# Patient Record
Sex: Female | Born: 1960 | Race: White | Hispanic: No | Marital: Married | Smoking: Never smoker
Health system: Southern US, Community
[De-identification: ages and names within clinical notes are randomized; demographics above are authoritative.]

## PROBLEM LIST (undated history)

## (undated) DIAGNOSIS — E559 Vitamin D deficiency, unspecified: Secondary | ICD-10-CM

## (undated) DIAGNOSIS — G4730 Sleep apnea, unspecified: Secondary | ICD-10-CM

## (undated) DIAGNOSIS — R0789 Other chest pain: Secondary | ICD-10-CM

## (undated) DIAGNOSIS — E785 Hyperlipidemia, unspecified: Secondary | ICD-10-CM

## (undated) DIAGNOSIS — M1990 Unspecified osteoarthritis, unspecified site: Secondary | ICD-10-CM

## (undated) DIAGNOSIS — N200 Calculus of kidney: Secondary | ICD-10-CM

## (undated) DIAGNOSIS — F988 Other specified behavioral and emotional disorders with onset usually occurring in childhood and adolescence: Secondary | ICD-10-CM

## (undated) DIAGNOSIS — R112 Nausea with vomiting, unspecified: Secondary | ICD-10-CM

## (undated) DIAGNOSIS — E039 Hypothyroidism, unspecified: Secondary | ICD-10-CM

## (undated) DIAGNOSIS — K219 Gastro-esophageal reflux disease without esophagitis: Secondary | ICD-10-CM

## (undated) DIAGNOSIS — E063 Autoimmune thyroiditis: Secondary | ICD-10-CM

## (undated) DIAGNOSIS — D6851 Activated protein C resistance: Secondary | ICD-10-CM

## (undated) DIAGNOSIS — S0300XA Dislocation of jaw, unspecified side, initial encounter: Secondary | ICD-10-CM

## (undated) DIAGNOSIS — I10 Essential (primary) hypertension: Secondary | ICD-10-CM

## (undated) DIAGNOSIS — Z87442 Personal history of urinary calculi: Secondary | ICD-10-CM

## (undated) DIAGNOSIS — F419 Anxiety disorder, unspecified: Secondary | ICD-10-CM

## (undated) DIAGNOSIS — Z9889 Other specified postprocedural states: Secondary | ICD-10-CM

## (undated) DIAGNOSIS — R0609 Other forms of dyspnea: Secondary | ICD-10-CM

## (undated) DIAGNOSIS — G43909 Migraine, unspecified, not intractable, without status migrainosus: Secondary | ICD-10-CM

## (undated) DIAGNOSIS — R0600 Dyspnea, unspecified: Secondary | ICD-10-CM

## (undated) HISTORY — DX: Other forms of dyspnea: R06.09

## (undated) HISTORY — DX: Autoimmune thyroiditis: E06.3

## (undated) HISTORY — PX: KNEE ARTHROSCOPY: SUR90

## (undated) HISTORY — PX: COLONOSCOPY: SHX174

## (undated) HISTORY — PX: WISDOM TOOTH EXTRACTION: SHX21

## (undated) HISTORY — PX: TUBAL LIGATION: SHX77

## (undated) HISTORY — DX: Other specified behavioral and emotional disorders with onset usually occurring in childhood and adolescence: F98.8

## (undated) HISTORY — DX: Vitamin D deficiency, unspecified: E55.9

## (undated) HISTORY — PX: FINGER SURGERY: SHX640

## (undated) HISTORY — PX: CYSTOSCOPY W/ URETEROSCOPY: SUR379

## (undated) HISTORY — DX: Hypothyroidism, unspecified: E03.9

## (undated) HISTORY — DX: Migraine, unspecified, not intractable, without status migrainosus: G43.909

## (undated) HISTORY — DX: Other chest pain: R07.89

## (undated) HISTORY — DX: Dyspnea, unspecified: R06.00

## (undated) HISTORY — DX: Dislocation of jaw, unspecified side, initial encounter: S03.00XA

## (undated) HISTORY — DX: Calculus of kidney: N20.0

## (undated) HISTORY — DX: Hyperlipidemia, unspecified: E78.5

## (undated) HISTORY — DX: Anxiety disorder, unspecified: F41.9

## (undated) HISTORY — DX: Gastro-esophageal reflux disease without esophagitis: K21.9

---

## 1998-03-19 ENCOUNTER — Other Ambulatory Visit: Admission: RE | Admit: 1998-03-19 | Discharge: 1998-03-19 | Payer: Self-pay | Admitting: Obstetrics and Gynecology

## 2000-08-17 ENCOUNTER — Encounter: Admission: RE | Admit: 2000-08-17 | Discharge: 2000-08-17 | Payer: Self-pay | Admitting: Obstetrics and Gynecology

## 2000-08-17 ENCOUNTER — Encounter: Payer: Self-pay | Admitting: Obstetrics and Gynecology

## 2010-06-08 ENCOUNTER — Emergency Department (HOSPITAL_COMMUNITY): Admission: EM | Admit: 2010-06-08 | Discharge: 2010-06-08 | Payer: Self-pay | Admitting: Emergency Medicine

## 2010-11-13 LAB — GLUCOSE, CAPILLARY: Glucose-Capillary: 242 mg/dL — ABNORMAL HIGH (ref 70–99)

## 2011-04-24 ENCOUNTER — Other Ambulatory Visit: Payer: Self-pay | Admitting: Obstetrics and Gynecology

## 2011-04-24 DIAGNOSIS — R928 Other abnormal and inconclusive findings on diagnostic imaging of breast: Secondary | ICD-10-CM

## 2011-05-05 ENCOUNTER — Ambulatory Visit
Admission: RE | Admit: 2011-05-05 | Discharge: 2011-05-05 | Disposition: A | Discharge status: Home / Self Care | Payer: BC Managed Care – PPO | Source: Ambulatory Visit | Attending: Obstetrics and Gynecology | Admitting: Obstetrics and Gynecology

## 2011-05-05 DIAGNOSIS — R928 Other abnormal and inconclusive findings on diagnostic imaging of breast: Secondary | ICD-10-CM

## 2011-11-09 IMAGING — CT CT HEAD W/O CM
1 series · 16 of 30 positions shown, 20 images · non-contrast
Comparison: None.

CLINICAL DATA: Headache and dizziness.

CT HEAD WITHOUT CONTRAST
TECHNIQUE: Contiguous axial images were obtained from the base of
the skull through the vertex without contrast.

[Series 2: headseq 4.8 h45s · axial · 0.43mm/px · z∈[-198,-46]mm · 16 of 36 slices shown, 20 images]
[im 2/36  brain]
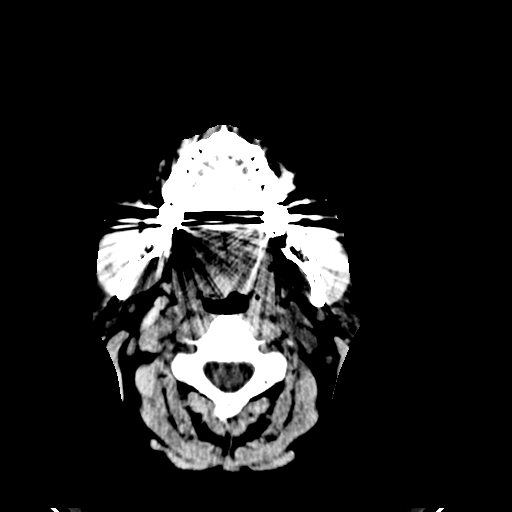
[im 2/36  bone]
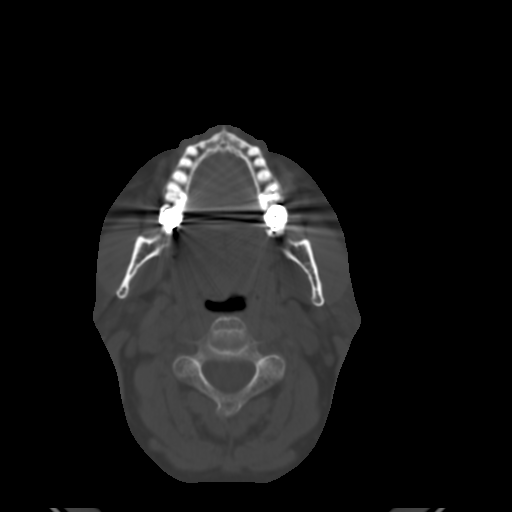
[im 4/36  brain]
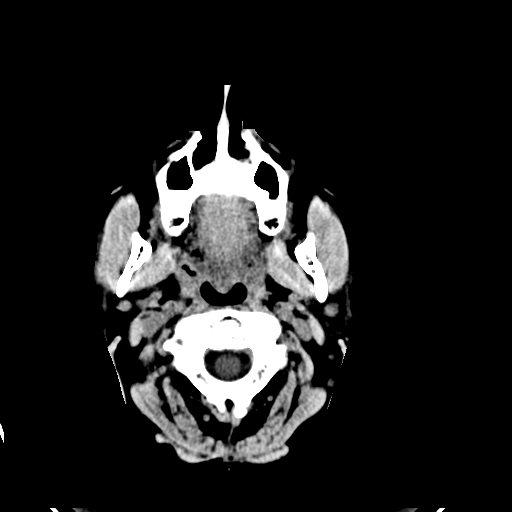
[im 7/36  brain]
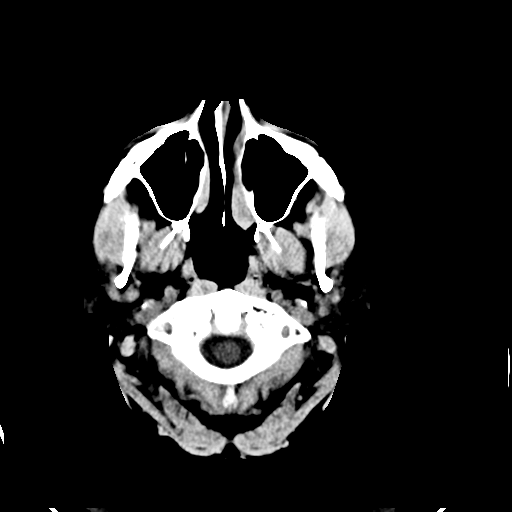
[im 9/36  brain]
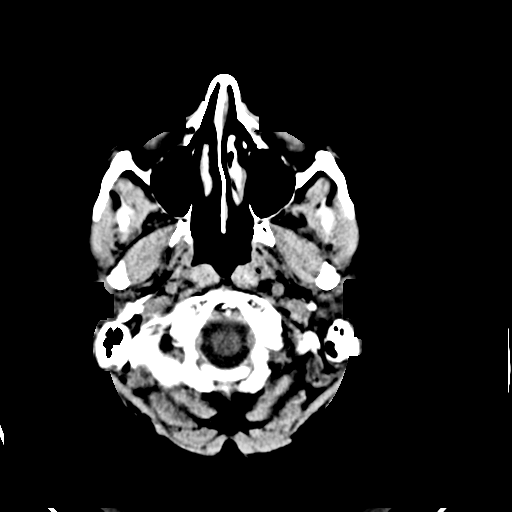
[im 10/36  brain]
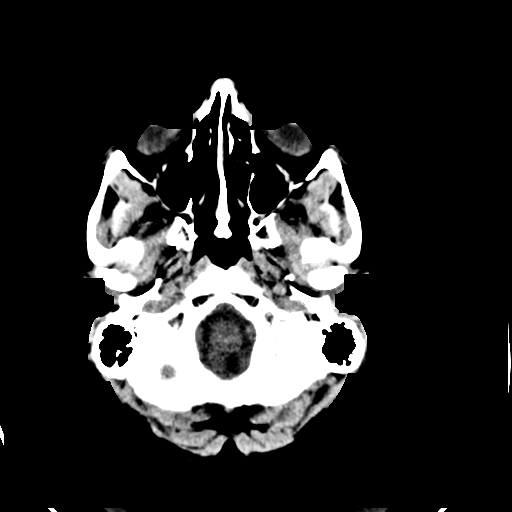
[im 10/36  bone]
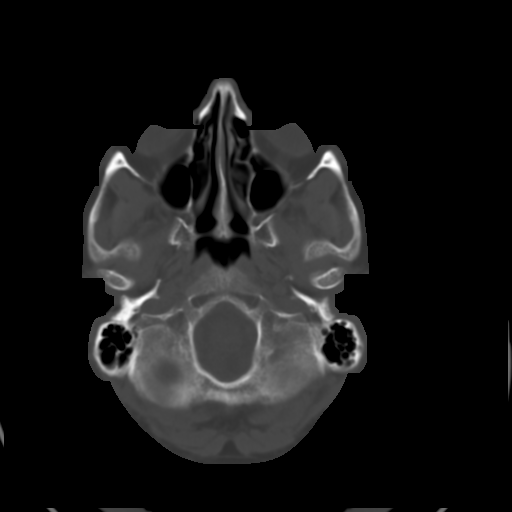
[im 13/36  brain]
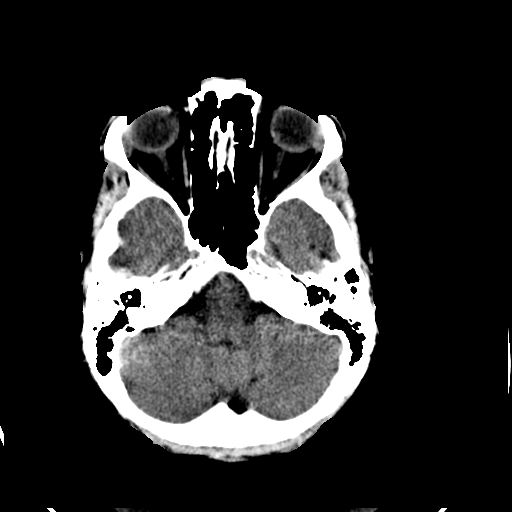
[im 15/36  brain]
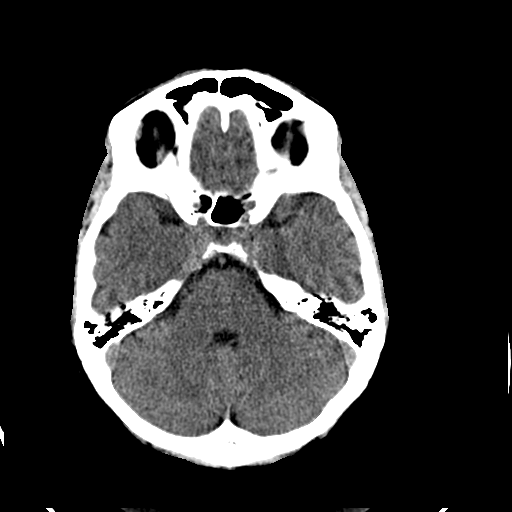
[im 17/36  brain]
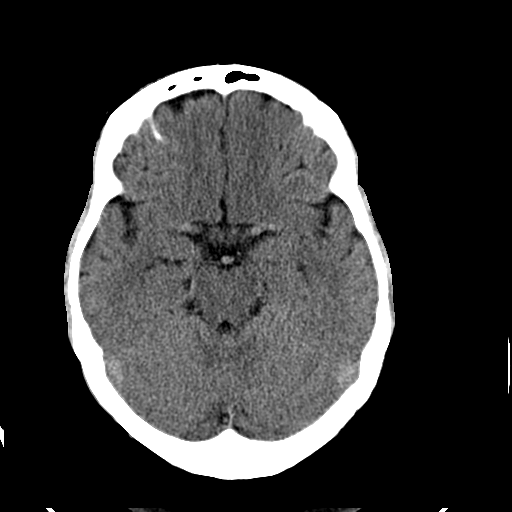
[im 19/36  brain]
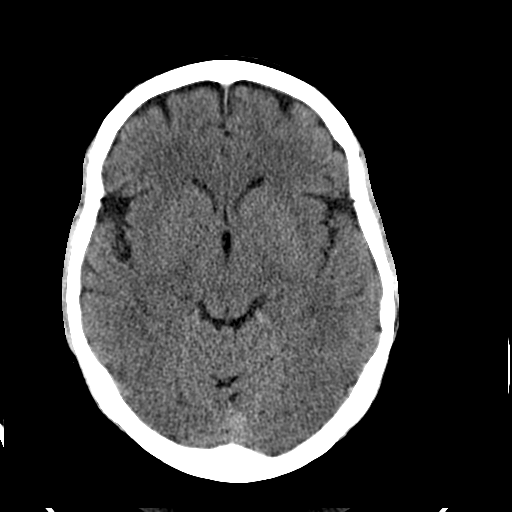
[im 19/36  bone]
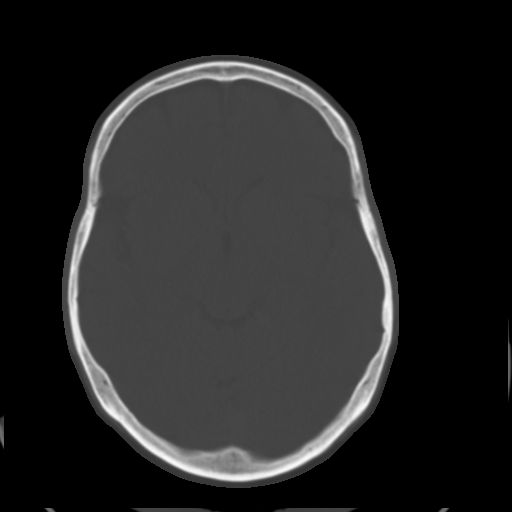
[im 21/36  brain]
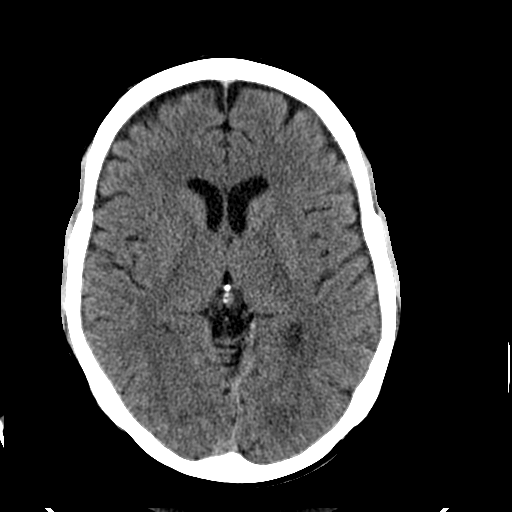
[im 23/36  brain]
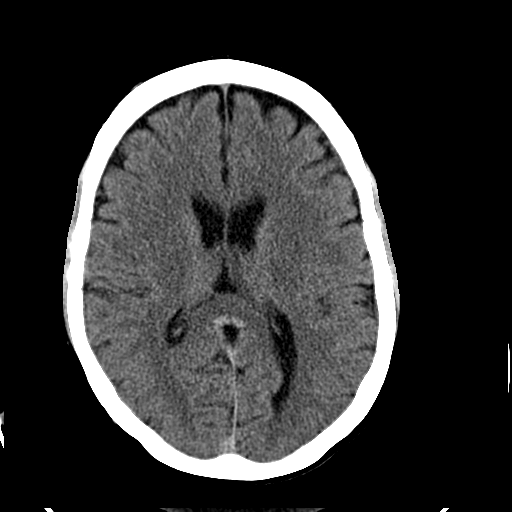
[im 26/36  brain]
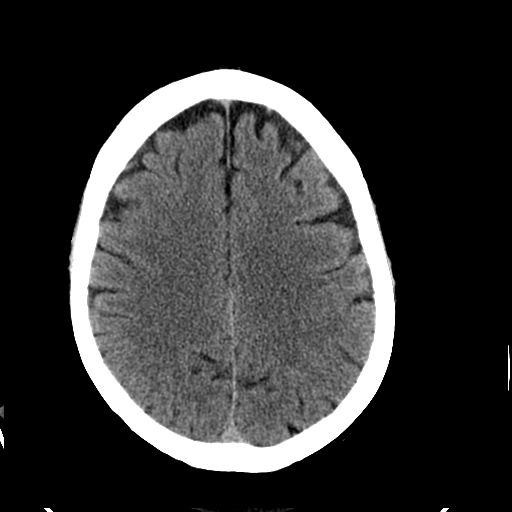
[im 27/36  brain]
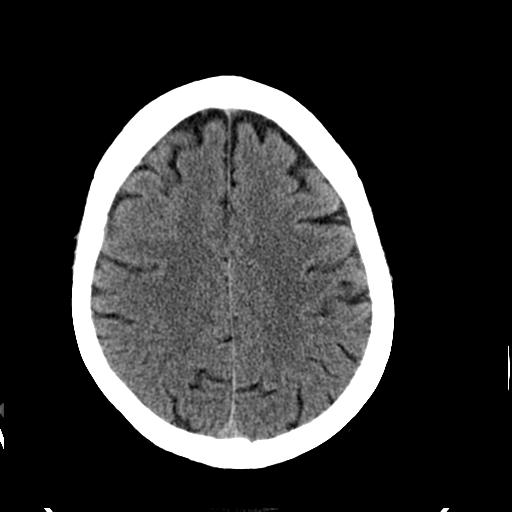
[im 27/36  bone]
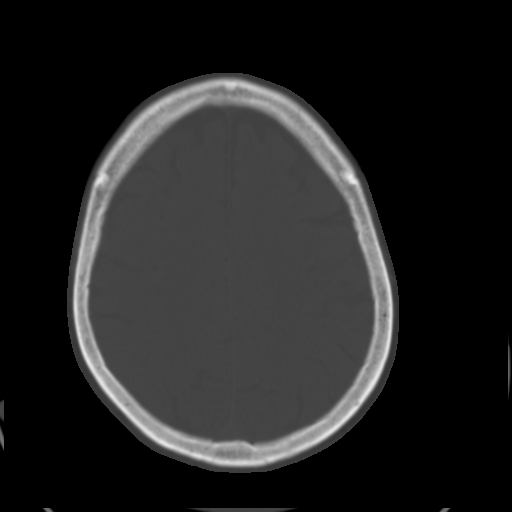
[im 29/36  brain]
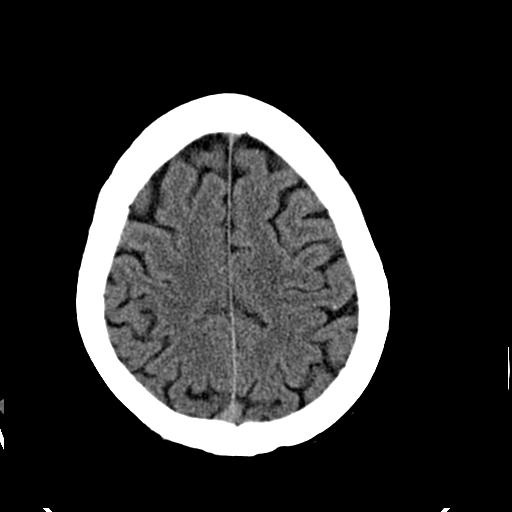
[im 32/36  brain]
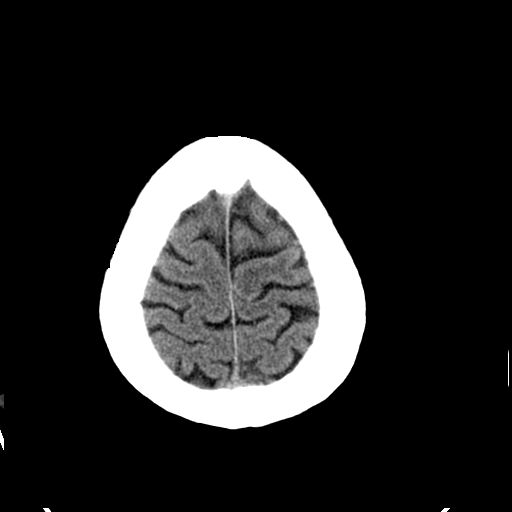
[im 34/36  brain]
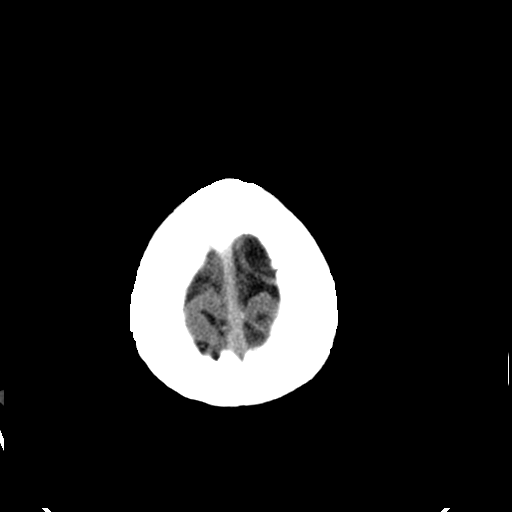

[16 of 30 positions shown; findings below may reference images not displayed]

FINDINGS: The brain appears normal without evidence of acute
infarction, hemorrhage, mass lesion, mass effect, midline shift or
abnormal extra-axial fluid collection.  No hydrocephalus.
Calvarium intact.
IMPRESSION: Negative exam.

## 2012-04-12 ENCOUNTER — Other Ambulatory Visit: Payer: Self-pay | Admitting: Obstetrics and Gynecology

## 2012-04-12 DIAGNOSIS — Z1231 Encounter for screening mammogram for malignant neoplasm of breast: Secondary | ICD-10-CM

## 2012-04-18 ENCOUNTER — Ambulatory Visit: Payer: BC Managed Care – PPO

## 2012-04-18 ENCOUNTER — Ambulatory Visit
Admission: RE | Admit: 2012-04-18 | Discharge: 2012-04-18 | Disposition: A | Discharge status: Home / Self Care | Payer: BC Managed Care – PPO | Source: Ambulatory Visit | Attending: Obstetrics and Gynecology | Admitting: Obstetrics and Gynecology

## 2012-04-18 DIAGNOSIS — Z1231 Encounter for screening mammogram for malignant neoplasm of breast: Secondary | ICD-10-CM

## 2012-05-05 ENCOUNTER — Ambulatory Visit: Payer: BC Managed Care – PPO

## 2013-04-14 ENCOUNTER — Other Ambulatory Visit: Payer: Self-pay

## 2013-04-14 DIAGNOSIS — Z1231 Encounter for screening mammogram for malignant neoplasm of breast: Secondary | ICD-10-CM

## 2013-05-02 ENCOUNTER — Ambulatory Visit: Payer: BC Managed Care – PPO

## 2013-05-18 ENCOUNTER — Ambulatory Visit
Admission: RE | Admit: 2013-05-18 | Discharge: 2013-05-18 | Disposition: A | Discharge status: Home / Self Care | Payer: BC Managed Care – PPO | Source: Ambulatory Visit

## 2013-05-18 DIAGNOSIS — Z1231 Encounter for screening mammogram for malignant neoplasm of breast: Secondary | ICD-10-CM

## 2014-04-25 ENCOUNTER — Other Ambulatory Visit: Payer: Self-pay

## 2014-04-25 DIAGNOSIS — Z1231 Encounter for screening mammogram for malignant neoplasm of breast: Secondary | ICD-10-CM

## 2014-05-21 ENCOUNTER — Ambulatory Visit
Admission: RE | Admit: 2014-05-21 | Discharge: 2014-05-21 | Disposition: A | Discharge status: Home / Self Care | Payer: BC Managed Care – PPO | Source: Ambulatory Visit

## 2014-05-21 DIAGNOSIS — Z1231 Encounter for screening mammogram for malignant neoplasm of breast: Secondary | ICD-10-CM

## 2015-10-22 IMAGING — MG DIGITAL SCREENING BILATERAL MAMMOGRAM WITH CAD
4 series · 4 of 4 positions shown · non-contrast
Comparison: Previous exam(s).

CLINICAL DATA: Screening.

EXAM:
DIGITAL SCREENING BILATERAL MAMMOGRAM WITH CAD

[R CC]
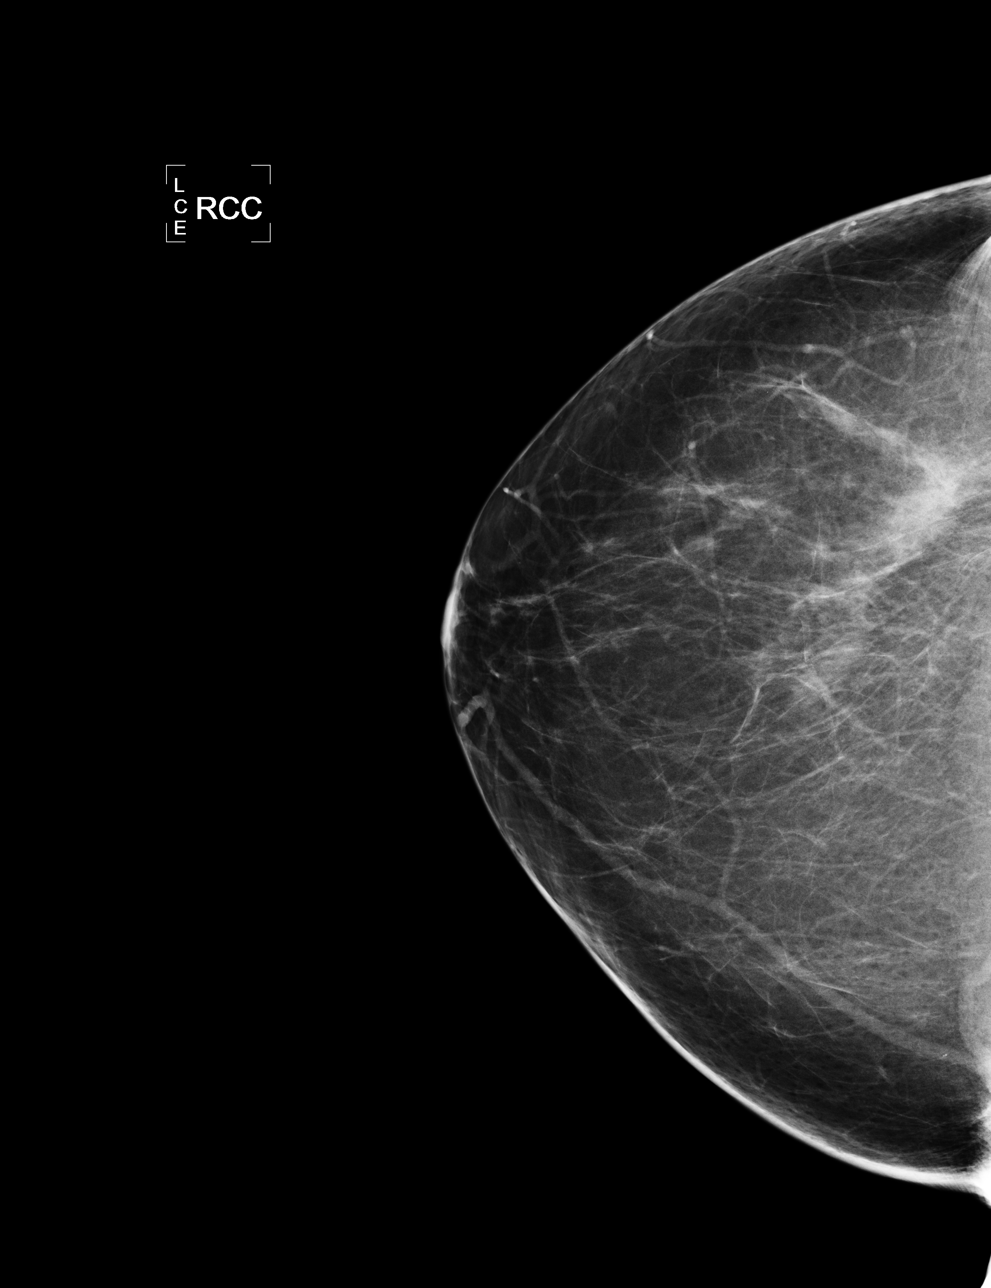

[L CC]
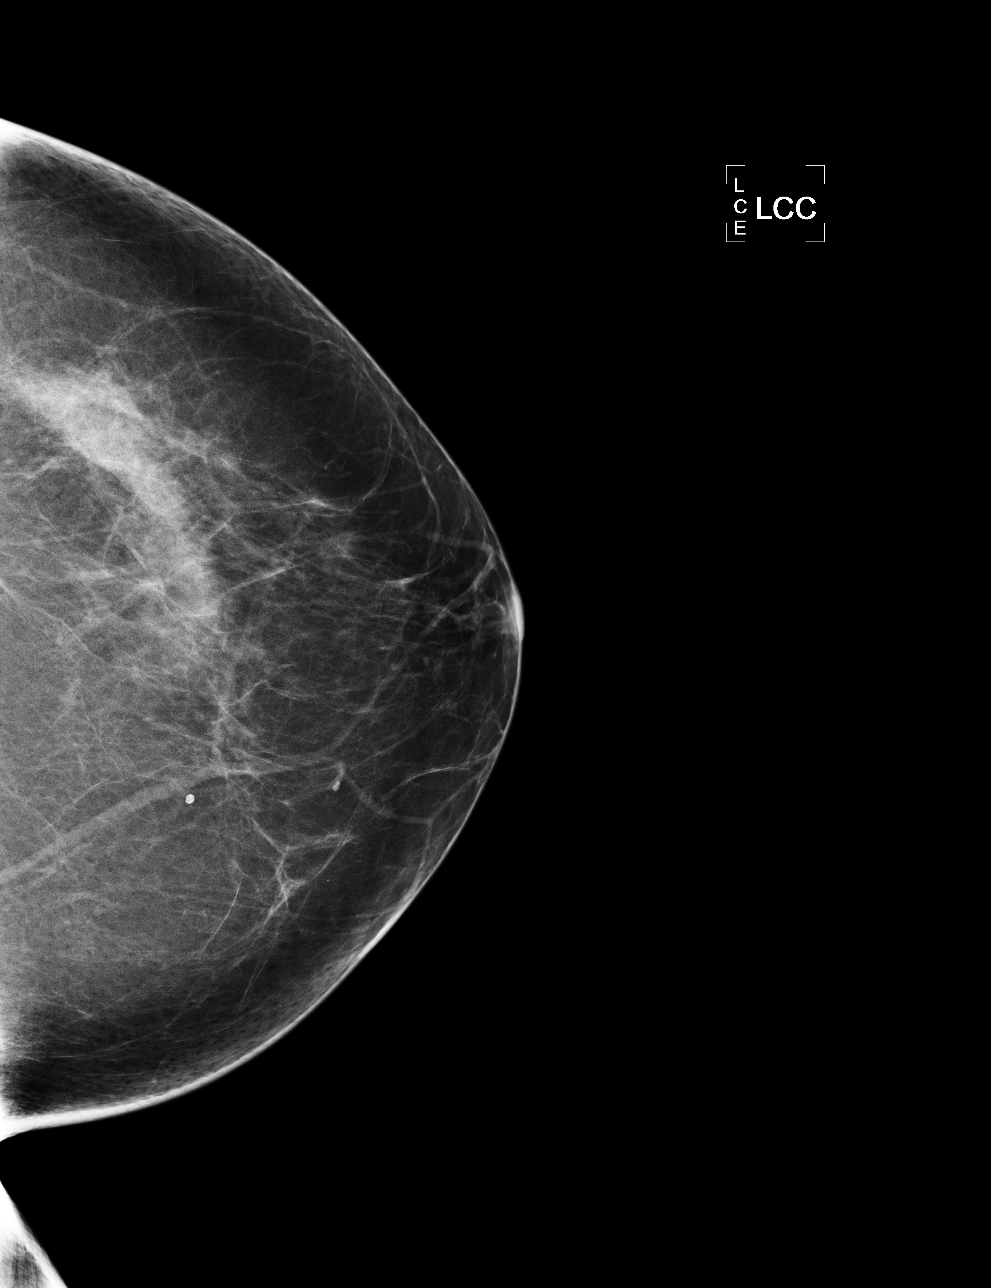

[L MLO]
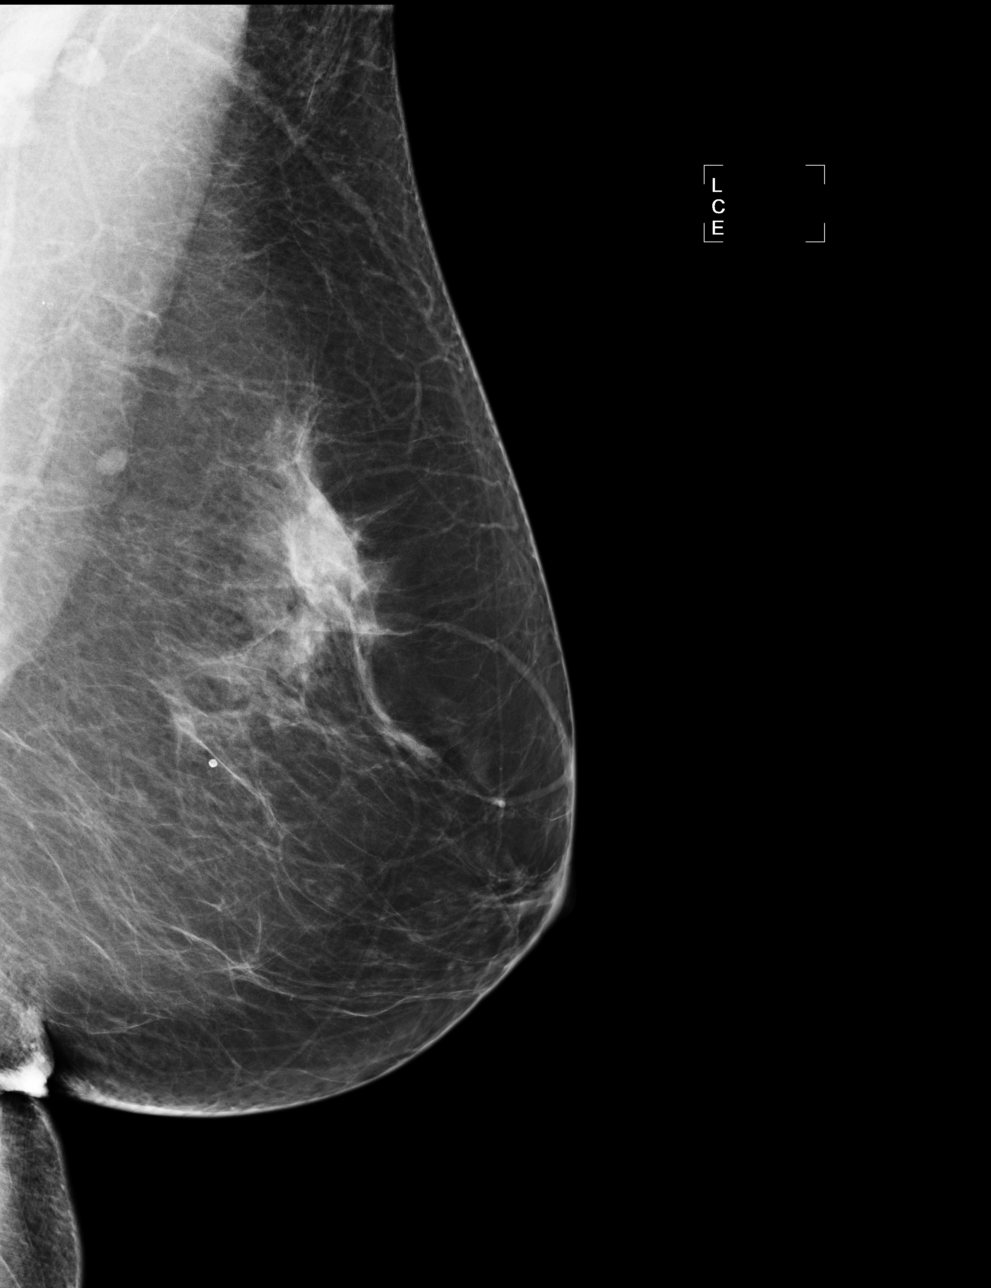

[R MLO]
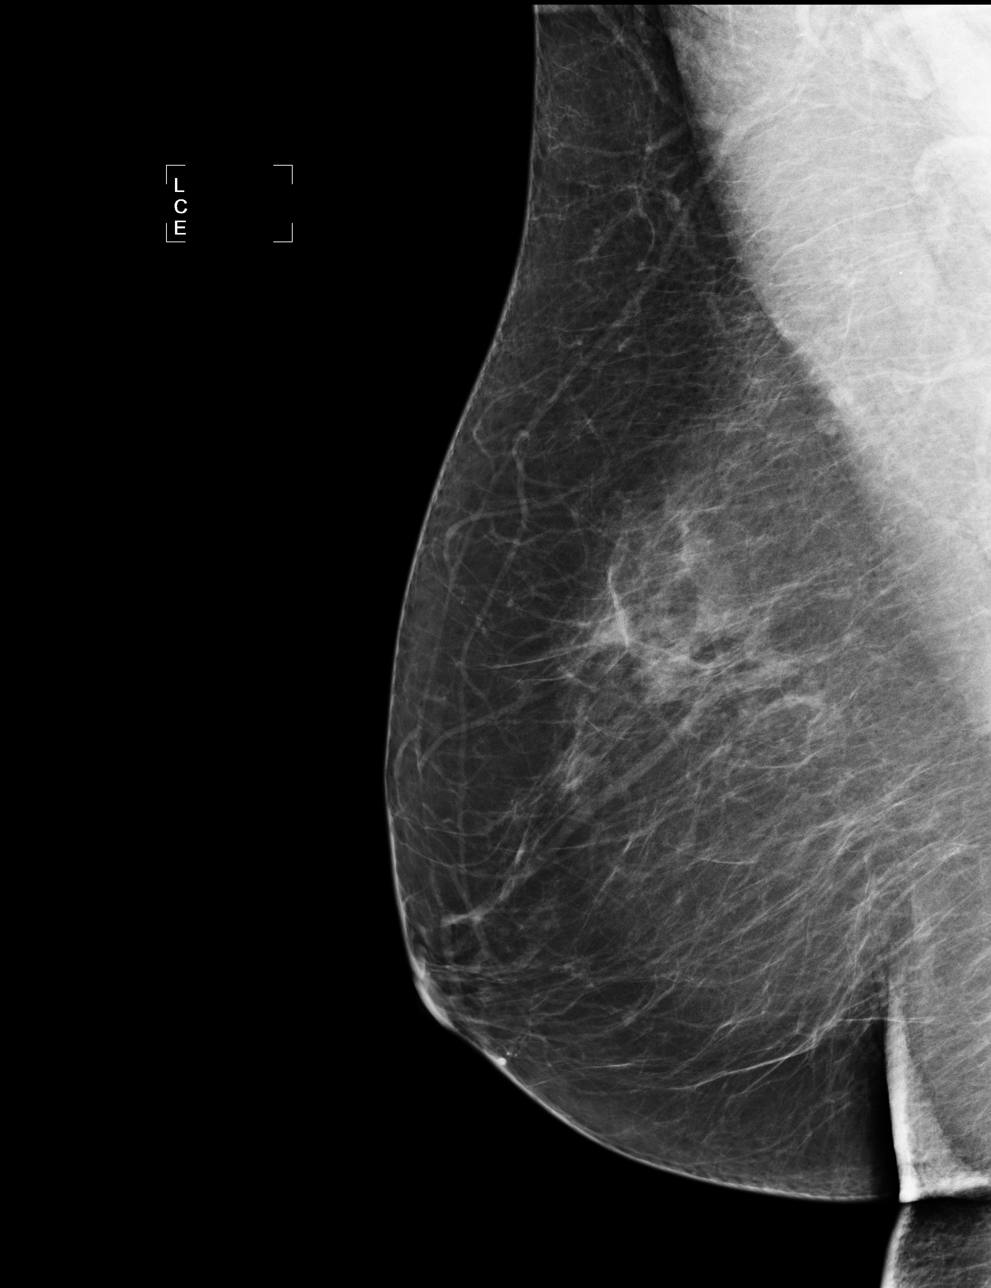

[4 of 4 positions shown; findings below may reference images not displayed]

ACR Breast Density Category c: The breast tissue is heterogeneously
dense, which may obscure small masses.
FINDINGS: There are no findings suspicious for malignancy. Images were
processed with CAD.
IMPRESSION: No mammographic evidence of malignancy. A result letter of this
screening mammogram will be mailed directly to the patient.

RECOMMENDATION:
Screening mammogram in one year. (Code:YJ-[id number])

BI-RADS CATEGORY  1: Negative.

## 2016-03-10 DIAGNOSIS — M26629 Arthralgia of temporomandibular joint, unspecified side: Secondary | ICD-10-CM | POA: Insufficient documentation

## 2016-03-10 DIAGNOSIS — N200 Calculus of kidney: Secondary | ICD-10-CM | POA: Insufficient documentation

## 2016-03-10 DIAGNOSIS — K2100 Gastro-esophageal reflux disease with esophagitis, without bleeding: Secondary | ICD-10-CM | POA: Insufficient documentation

## 2016-03-10 DIAGNOSIS — E559 Vitamin D deficiency, unspecified: Secondary | ICD-10-CM | POA: Insufficient documentation

## 2016-03-10 DIAGNOSIS — F419 Anxiety disorder, unspecified: Secondary | ICD-10-CM | POA: Insufficient documentation

## 2016-03-10 DIAGNOSIS — E7800 Pure hypercholesterolemia, unspecified: Secondary | ICD-10-CM | POA: Insufficient documentation

## 2016-03-10 DIAGNOSIS — F909 Attention-deficit hyperactivity disorder, unspecified type: Secondary | ICD-10-CM | POA: Insufficient documentation

## 2016-09-28 ENCOUNTER — Other Ambulatory Visit: Payer: Self-pay | Admitting: Family Medicine

## 2016-09-28 DIAGNOSIS — E039 Hypothyroidism, unspecified: Secondary | ICD-10-CM

## 2016-09-28 DIAGNOSIS — E010 Iodine-deficiency related diffuse (endemic) goiter: Secondary | ICD-10-CM

## 2016-10-02 ENCOUNTER — Other Ambulatory Visit: Payer: BC Managed Care – PPO

## 2016-10-05 ENCOUNTER — Other Ambulatory Visit: Payer: BC Managed Care – PPO

## 2016-11-06 DIAGNOSIS — G43019 Migraine without aura, intractable, without status migrainosus: Secondary | ICD-10-CM | POA: Insufficient documentation

## 2016-11-19 DIAGNOSIS — E038 Other specified hypothyroidism: Secondary | ICD-10-CM | POA: Insufficient documentation

## 2016-11-19 DIAGNOSIS — E063 Autoimmune thyroiditis: Secondary | ICD-10-CM

## 2016-12-18 DIAGNOSIS — R0789 Other chest pain: Secondary | ICD-10-CM | POA: Insufficient documentation

## 2017-01-01 ENCOUNTER — Other Ambulatory Visit: Payer: Self-pay

## 2017-01-19 ENCOUNTER — Ambulatory Visit: Payer: Self-pay | Admitting: Interventional Cardiology

## 2017-05-11 DIAGNOSIS — N183 Chronic kidney disease, stage 3 unspecified: Secondary | ICD-10-CM | POA: Insufficient documentation

## 2017-05-11 DIAGNOSIS — E669 Obesity, unspecified: Secondary | ICD-10-CM | POA: Insufficient documentation

## 2017-08-26 ENCOUNTER — Ambulatory Visit: Payer: Self-pay | Admitting: Neurology

## 2017-10-13 ENCOUNTER — Ambulatory Visit: Payer: BC Managed Care – PPO | Admitting: Neurology

## 2017-10-13 ENCOUNTER — Encounter: Payer: Self-pay | Admitting: Neurology

## 2017-10-13 DIAGNOSIS — G43709 Chronic migraine without aura, not intractable, without status migrainosus: Secondary | ICD-10-CM | POA: Diagnosis not present

## 2017-10-13 DIAGNOSIS — IMO0002 Reserved for concepts with insufficient information to code with codable children: Secondary | ICD-10-CM

## 2017-10-13 MED ORDER — TOPIRAMATE ER 50 MG PO CAP24: 50.0000 mg | ORAL_CAPSULE | Freq: Every day | ORAL | 0 refills | Status: DC

## 2017-10-13 MED ORDER — TOPIRAMATE ER 50 MG PO CAP24: 50.0000 mg | ORAL_CAPSULE | Freq: Every day | ORAL | 11 refills | Status: DC

## 2017-10-13 MED ORDER — SUMATRIPTAN SUCCINATE 100 MG PO TABS: 100.0000 mg | ORAL_TABLET | Freq: Once | ORAL | 12 refills | Status: AC | PRN

## 2017-10-13 NOTE — Progress Notes (Signed)
[facility]    Provider:  Dr Lucia GaskinsAhern Referring Provider: Gwenlyn FoundEksir, Samantha A, MD Primary Care Physician:  Richmond CampbellKaplan, Kristen W., PA-C  CC:  Headache  HPI:  Brittany Todd is a 57 y.o. female here as a referral from Dr. Rene KocherEksir for headache.  Past medical history of hypothyroidism, goiter, cyclical migraine headaches, ADD, anxiety, CKD, migraines. Her mother and grandmother had migraines. Qudexy works well but she has CP and SOB.  She has had thm since she was a little girl the age of 57. They were sporadic. Worsened in her 40s worsened after menopause. They can last up to 15 days. When she has the headache she takes ibuprofen but her kidney function worsened and she use Tylenol. At Christmas time she had them 5 days in a row. Since then has been doing well. Migraine starts on the left behind the eye, Pulsing. She has nausea, photophobia, phonophobia. An ice pack helps.  Doesn't wake with the headaches. Stress can be a trigger.No other focal neurologic deficits, associated symptoms, inciting events or modifiable factors.  Reviewed notes, labs and imaging from outside physicians, which showed:  Reviewed referring physician notes.  Patient had a previous neurologist and diagnosed with cyclical migraine headaches, she was put on Topamax but can only take 50 mg Exar due to side effects with higher doses.  She uses Maxalt for an emergency has only had to take 1 pill.exam was normal.   Patient has been on Qudexy.  [facility] [place].  Qudexy working well to prevent migraine.  However at higher doses she had chest pain and shortness of breath.  Thought may be anxiety, recommended a cardiac evaluation.  She felt as though she was hard to catch her breath when walking.  Thought it was due to the Qudexy.  Could not go up to 100 mg.  She has been tolerating 50 mg.  Headaches are left-sided and associated with photophobia and nausea.  She states Cymbalta  caused fatigue.  Went back to Lexapro.  She has had headaches for 8 years.  Starts as pain in the left side of the face and head.  Constant pain feels like eye is swollen.  Can last 15 days.  Takes ibuprofen around the clock while she has a headache sometimes has photophobia no nausea.  No relief with sleep.  Activity makes it worse.  Occurs about every other month.  Lexapro did not help with the headache.  Mother had migraines  Reviewed labs from August 2018 creatinine 1.11, BUN 17 otherwise BMP unremarkable  Review of Systems: Patient complains of symptoms per HPI as well as the following symptoms: headache. Pertinent negatives and positives per HPI. All others negative.   Social History   Socioeconomic History  . Marital status: Married    Spouse name: Not on file  . Number of children: Not on file  . Years of education: Not on file  . Highest education level: Not on file  Social Needs  . Financial resource strain: Not on file  . Food insecurity - worry: Not on file  . Food insecurity - inability: Not on file  . Transportation needs - medical: Not on file  . Transportation needs - non-medical: Not on file  Occupational History  . Not on file  Tobacco Use  . Smoking status: Never Smoker  . Smokeless tobacco: Never Used  Substance and Sexual Activity  . Alcohol use: No  . Drug use: No  . Sexual  activity: Not on file  Other Topics Concern  . Not on file  Social History Narrative   Lives at home with her husband   1 glass of caffeine daily    Family History  Problem Relation Age of Onset  . Heart attack Father   . Alzheimer's disease Maternal Grandmother     Past Medical History:  Diagnosis Date  . ADD (attention deficit disorder)   . Anxiety   . Chest tightness   . DOE (dyspnea on exertion)   . GERD (gastroesophageal reflux disease)   . Hashimoto's disease   . Hyperlipemia   . Hypothyroid   . Migraines   . Renal stone   . TMJ (dislocation of temporomandibular  joint)   . Vitamin D deficiency     Past Surgical History:  Procedure Laterality Date  . CESAREAN SECTION    . FINGER SURGERY Left   . TUBAL LIGATION      Current Outpatient Medications  Medication Sig Dispense Refill  . acyclovir (ZOVIRAX) 400 MG tablet Take 400 mg by mouth 3 (three) times daily as needed.    Marland Kitchen amphetamine-dextroamphetamine (ADDERALL) 10 MG tablet Take 10 mg by mouth daily with breakfast.    . atorvastatin (LIPITOR) 20 MG tablet Take 20 mg by mouth at bedtime.    Marland Kitchen b complex vitamins tablet Take 1 tablet by mouth daily.    . Cholecalciferol (VITAMIN D3 PO) Take 1,000 Int'l Units by mouth daily.    Marland Kitchen escitalopram (LEXAPRO) 20 MG tablet Take 10 mg by mouth daily.    . Ginkgo Biloba 60 MG TABS Take 1 tablet by mouth daily.    Marland Kitchen levothyroxine (SYNTHROID, LEVOTHROID) 88 MCG tablet Take 88 mcg by mouth daily before breakfast.    . Multiple Minerals-Vitamins (CAL MAG ZINC +D3) TABS Take 1 tablet by mouth daily.    . Omega-3 Fatty Acids (FISH OIL OMEGA-3 PO) Take 2 capsules by mouth. Fish Oil 1200 mg per soft gel Omega 3, 600 mg per soft gel    . omeprazole (PRILOSEC OTC) 20 MG tablet Take 20 mg by mouth as needed.    . SUMAtriptan (IMITREX) 100 MG tablet Take 1 tablet (100 mg total) by mouth once as needed for up to 1 dose. May repeat in 2 hours if headache persists or recurs. 10 tablet 12  . Topiramate ER (TROKENDI XR) 50 MG CP24 Take 50 mg by mouth at bedtime. 30 capsule 11  . Topiramate ER (TROKENDI XR) 50 MG CP24 Take 50 mg by mouth at bedtime. 28 capsule 0   No current facility-administered medications for this visit.     Allergies as of 10/13/2017 - Review Complete 10/13/2017  Allergen Reaction Noted  . Codeine Other (See Comments) 01/01/2017  . Cymbalta [duloxetine hcl] Other (See Comments) 01/01/2017  . Morphine and related Other (See Comments) 01/01/2017    Vitals: BP (!) 144/84 (BP Location: Left Arm, Patient Position: Sitting)   Pulse 83   Ht 4\' 11"   (1.499 m)   Wt 175 lb (79.4 kg)   BMI 35.35 kg/m  Last Weight:  Wt Readings from Last 1 Encounters:  10/13/17 175 lb (79.4 kg)   Last Height:   Ht Readings from Last 1 Encounters:  10/13/17 4\' 11"  (1.499 m)   Physical exam: Exam: Gen: NAD, conversant, well nourised, obese, well groomed                     CV: RRR, no MRG. No Carotid  Bruits. No peripheral edema, warm, nontender Eyes: Conjunctivae clear without exudates or hemorrhage  Neuro: Detailed Neurologic Exam  Speech:    Speech is normal; fluent and spontaneous with normal comprehension.  Cognition:    The patient is oriented to person, place, and time;     recent and remote memory intact;     language fluent;     normal attention, concentration,     fund of knowledge Cranial Nerves:    The pupils are equal, round, and reactive to light. The fundi are normal and spontaneous venous pulsations are present. Visual fields are full to finger confrontation. Extraocular movements are intact. Trigeminal sensation is intact and the muscles of mastication are normal. The face is symmetric. The palate elevates in the midline. Hearing intact. Voice is normal. Shoulder shrug is normal. The tongue has normal motion without fasciculations.   Coordination:    Normal finger to nose and heel to shin. Normal rapid alternating movements.   Gait:    Heel-toe and tandem gait are normal.   Motor Observation:    No asymmetry, no atrophy, and no involuntary movements noted. Tone:    Normal muscle tone.    Posture:    Posture is normal. normal erect    Strength:    Strength is V/V in the upper and lower limbs.      Sensation: intact to LT     Reflex Exam:  DTR's:    Deep tendon reflexes in the upper and lower extremities are normal bilaterally.   Toes:    The toes are downgoing bilaterally.   Clonus:    Clonus is absent.       Assessment/Plan:  57 year old with chronic migraines  Chest pain and shortness of breath: Less  likely Topiramate ER. Cardiology: recommend Stop Qudexy. Start Trpokendi 50mg  at bedtime. Imitrex: Please take one tablet at the onset of your headache. If it does not improve the symptoms please take one additional tablet. Do not take more then 2 tablets in 24hrs. Do not take use more then 2 to 3 times in a week. Can take with Tylenol.   Discussed: To prevent or relieve headaches, try the following: Cool Compress. Lie down and place a cool compress on your head.  Avoid headache triggers. If certain foods or odors seem to have triggered your migraines in the past, avoid them. A headache diary might help you identify triggers.  Include physical activity in your daily routine. Try a daily walk or other moderate aerobic exercise.  Manage stress. Find healthy ways to cope with the stressors, such as delegating tasks on your to-do list.  Practice relaxation techniques. Try deep breathing, yoga, massage and visualization.  Eat regularly. Eating regularly scheduled meals and maintaining a healthy diet might help prevent headaches. Also, drink plenty of fluids.  Follow a regular sleep schedule. Sleep deprivation might contribute to headaches Consider biofeedback. With this mind-body technique, you learn to control certain bodily functions - such as muscle tension, heart rate and blood pressure - to prevent headaches or reduce headache pain.    Proceed to emergency room if you experience new or worsening symptoms or symptoms do not resolve, if you have new neurologic symptoms or if headache is severe, or for any concerning symptom.   Provided education and documentation from [employer] headache [employer] toolbox including articles on: chronic migraine medication overuse headache, chronic migraines, prevention of migraines, behavioral and other nonpharmacologic treatments for headache.   Cc: Dr. Rene Kocher and Arlyce Dice  Naomie Dean, MD  [facility] [address] [place],  [place] [address]  Phone [phone] Fax [phone]

## 2017-10-13 NOTE — Patient Instructions (Addendum)
Stop Qudexy. Start Trpokendi 50mg  at bedtime. Imitrex: Please take one tablet at the onset of your headache. If it does not improve the symptoms please take one additional tablet. Do not take more then 2 tablets in 24hrs. Do not take use more then 2 to 3 times in a week. Can take with Tylenol.  Topiramate extended-release capsules What is this medicine? TOPIRAMATE (toe PYRE a mate) is used to treat seizures in adults or children with epilepsy. It is also used for the prevention of migraine headaches. This medicine may be used for other purposes; ask your health care provider or pharmacist if you have questions. COMMON BRAND NAME(S): Trokendi XR What should I tell my health care provider before I take this medicine? They need to know if you have any of these conditions: -cirrhosis of the liver or liver disease -diarrhea -glaucoma -kidney stones or kidney disease -lung disease like asthma, obstructive pulmonary disease, emphysema -metabolic acidosis -on a ketogenic diet -scheduled for surgery or a procedure -suicidal thoughts, plans, or attempt; a previous suicide attempt by you or a family member -an unusual or allergic reaction to topiramate, other medicines, foods, dyes, or preservatives -pregnant or trying to get pregnant -breast-feeding How should I use this medicine? Take this medicine by mouth with a glass of water. Follow the directions on the prescription label. Trokendi XR capsules must be swallowed whole. Do not sprinkle on food, break, crush, dissolve, or chew. Qudexy XR capsules may be swallowed whole or opened and sprinkled on a small amount of soft food. This mixture must be swallowed immediately. Do not chew or store mixture for later use. You may take this medicine with meals. Take your medicine at regular intervals. Do not take it more often than directed. Talk to your pediatrician regarding the use of this medicine in children. Special care may be needed. While Trokendi XR  may be prescribed for children as young as 6 years and Qudexy XR may be prescribed for children as young as 2 years for selected conditions, precautions do apply. Overdosage: If you think you have taken too much of this medicine contact a poison control center or emergency room at once. NOTE: This medicine is only for you. Do not share this medicine with others. What if I miss a dose? If you miss a dose, take it as soon as you can. If it is almost time for your next dose, take only that dose. Do not take double or extra doses. What may interact with this medicine? Do not take this medicine with any of the following medications: -probenecid This medicine may also interact with the following medications: -acetazolamide -alcohol -amitriptyline -birth control pills -digoxin -hydrochlorothiazide -lithium -medicines for pain, sleep, or muscle relaxation -metformin -methazolamide -other seizure or epilepsy medicines -pioglitazone -risperidone This list may not describe all possible interactions. Give your health care provider a list of all the medicines, herbs, non-prescription drugs, or dietary supplements you use. Also tell them if you smoke, drink alcohol, or use illegal drugs. Some items may interact with your medicine. What should I watch for while using this medicine? Visit your doctor or health care professional for regular checks on your progress. Do not stop taking this medicine suddenly. This increases the risk of seizures if you are using this medicine to control epilepsy. Wear a medical identification bracelet or chain to say you have epilepsy or seizures, and carry a card that lists all your medicines. This medicine can decrease sweating and increase your body temperature.  Watch for signs of deceased sweating or fever, especially in children. Avoid extreme heat, hot baths, and saunas. Be careful about exercising, especially in hot weather. Contact your health care provider right away  if you notice a fever or decrease in sweating. You should drink plenty of fluids while taking this medicine. If you have had kidney stones in the past, this will help to reduce your chances of forming kidney stones. If you have stomach pain, with nausea or vomiting and yellowing of your eyes or skin, call your doctor immediately. You may get drowsy, dizzy, or have blurred vision. Do not drive, use machinery, or do anything that needs mental alertness until you know how this medicine affects you. To reduce dizziness, do not sit or stand up quickly, especially if you are an older patient. Alcohol can increase drowsiness and dizziness. Avoid alcoholic drinks. Do not drink alcohol for 6 hours before or 6 hours after taking Trokendi XR. If you notice blurred vision, eye pain, or other eye problems, seek medical attention at once for an eye exam. The use of this medicine may increase the chance of suicidal thoughts or actions. Pay special attention to how you are responding while on this medicine. Any worsening of mood, or thoughts of suicide or dying should be reported to your health care professional right away. This medicine may increase the chance of developing metabolic acidosis. If left untreated, this can cause kidney stones, bone disease, or slowed growth in children. Symptoms include breathing fast, fatigue, loss of appetite, irregular heartbeat, or loss of consciousness. Call your doctor immediately if you experience any of these side effects. Also, tell your doctor about any surgery you plan on having while taking this medicine since this may increase your risk for metabolic acidosis. Birth control pills may not work properly while you are taking this medicine. Talk to your doctor about using an extra method of birth control. Women who become pregnant while using this medicine may enroll in the [place] Antiepileptic Drug Pregnancy Registry by calling 727 [phone]. This registry collects  information about the safety of antiepileptic drug use during pregnancy. What side effects may I notice from receiving this medicine? Side effects that you should report to your doctor or health care professional as soon as possible: -allergic reactions like skin rash, itching or hives, swelling of the face, lips, or tongue -decreased sweating and/or rise in body temperature -depression -difficulty breathing, fast or irregular breathing patterns -difficulty speaking -difficulty walking or controlling muscle movements -hearing impairment -redness, blistering, peeling or loosening of the skin, including inside the mouth -tingling, pain or numbness in the hands or feet -unusually weak or tired -worsening of mood, thoughts or actions of suicide or dying Side effects that usually do not require medical attention (report to your doctor or health care professional if they continue or are bothersome): -altered taste -back pain, joint or muscle aches and pains -diarrhea, or constipation -headache -loss of appetite -nausea -stomach upset, indigestion -tremors This list may not describe all possible side effects. Call your doctor for medical advice about side effects. You may report side effects to FDA at 1-800-FDA-1088. Where should I keep my medicine? Keep out of the reach of children. Store at room temperature between 15 and 30 degrees C (59 and 86 degrees F) in a tightly closed container. Protect from moisture. Throw away any unused medicine after the expiration date. NOTE: This sheet is a summary. It may not cover all possible information. If you have questions  about this medicine, talk to your doctor, pharmacist, or health care provider.  2018 Elsevier/Gold Standard (2015-12-06 12:33:11)    Sumatriptan tablets What is this medicine? SUMATRIPTAN (soo ma TRIP tan) is used to treat migraines with or without aura. An aura is a strange feeling or visual disturbance that warns you of an  attack. It is not used to prevent migraines. This medicine may be used for other purposes; ask your health care provider or pharmacist if you have questions. COMMON BRAND NAME(S): Imitrex, Migraine Pack What should I tell my health care provider before I take this medicine? They need to know if you have any of these conditions: -circulation problems in fingers and toes -diabetes -heart disease -high blood pressure -high cholesterol -history of irregular heartbeat -history of stroke -kidney disease -liver disease -postmenopausal or surgical removal of uterus and ovaries -seizures -smoke tobacco -stomach or intestine problems -an unusual or allergic reaction to sumatriptan, other medicines, foods, dyes, or preservatives -pregnant or trying to get pregnant -breast-feeding How should I use this medicine? Take this medicine by mouth with a glass of water. Follow the directions on the prescription label. This medicine is taken at the first symptoms of a migraine. It is not for everyday use. If your migraine headache returns after one dose, you can take another dose as directed. You must leave at least 2 hours between doses, and do not take more than 100 mg as a single dose. Do not take more than 200 mg total in any 24 hour period. If there is no improvement at all after the first dose, do not take a second dose without talking to your doctor or health care professional. Do not take your medicine more often than directed. Talk to your pediatrician regarding the use of this medicine in children. Special care may be needed. Overdosage: If you think you have taken too much of this medicine contact a poison control center or emergency room at once. NOTE: This medicine is only for you. Do not share this medicine with others. What if I miss a dose? This does not apply; this medicine is not for regular use. What may interact with this medicine? Do not take this medicine with any of the following  medicines: -cocaine -ergot alkaloids like dihydroergotamine, ergonovine, ergotamine, methylergonovine -feverfew -MAOIs like Carbex, Eldepryl, Marplan, Nardil, and Parnate -other medicines for migraine headache like almotriptan, eletriptan, frovatriptan, naratriptan, rizatriptan, zolmitriptan -tryptophan This medicine may also interact with the following medications: -certain medicines for depression, anxiety, or psychotic disturbances This list may not describe all possible interactions. Give your health care provider a list of all the medicines, herbs, non-prescription drugs, or dietary supplements you use. Also tell them if you smoke, drink alcohol, or use illegal drugs. Some items may interact with your medicine. What should I watch for while using this medicine? Only take this medicine for a migraine headache. Take it if you get warning symptoms or at the start of a migraine attack. It is not for regular use to prevent migraine attacks. You may get drowsy or dizzy. Do not drive, use machinery, or do anything that needs mental alertness until you know how this medicine affects you. To reduce dizzy or fainting spells, do not sit or stand up quickly, especially if you are an older patient. Alcohol can increase drowsiness, dizziness and flushing. Avoid alcoholic drinks. Smoking cigarettes may increase the risk of heart-related side effects from using this medicine. If you take migraine medicines for 10 or more days  a month, your migraines may get worse. Keep a diary of headache days and medicine use. Contact your healthcare professional if your migraine attacks occur more frequently. What side effects may I notice from receiving this medicine? Side effects that you should report to your doctor or health care professional as soon as possible: -allergic reactions like skin rash, itching or hives, swelling of the face, lips, or tongue -bloody or watery diarrhea -hallucination, loss of contact with  reality -pain, tingling, numbness in the face, hands, or feet -seizures -signs and symptoms of a blood clot such as breathing problems; changes in vision; chest pain; severe, sudden headache; pain, swelling, warmth in the leg; trouble speaking; sudden numbness or weakness of the face, arm, or leg -signs and symptoms of a dangerous change in heartbeat or heart rhythm like chest pain; dizziness; fast or irregular heartbeat; palpitations, feeling faint or lightheaded; falls; breathing problems -signs and symptoms of a stroke like changes in vision; confusion; trouble speaking or understanding; severe headaches; sudden numbness or weakness of the face, arm, or leg; trouble walking; dizziness; loss of balance or coordination -stomach pain Side effects that usually do not require medical attention (report to your doctor or health care professional if they continue or are bothersome): -changes in taste -facial flushing -headache -muscle cramps -muscle pain -nausea, vomiting -weak or tired This list may not describe all possible side effects. Call your doctor for medical advice about side effects. You may report side effects to FDA at 1-800-FDA-1088. Where should I keep my medicine? Keep out of the reach of children. Store at room temperature between 2 and 30 degrees C (36 and 86 degrees F). Throw away any unused medicine after the expiration date. NOTE: This sheet is a summary. It may not cover all possible information. If you have questions about this medicine, talk to your doctor, pharmacist, or health care provider.  2018 Elsevier/Gold Standard (2015-09-19 12:38:23)

## 2017-10-14 ENCOUNTER — Telehealth: Payer: Self-pay | Admitting: *Deleted

## 2017-10-14 NOTE — Telephone Encounter (Signed)
Referral sent to scheduling. 

## 2017-10-16 ENCOUNTER — Encounter: Payer: Self-pay | Admitting: Neurology

## 2017-10-16 DIAGNOSIS — G43709 Chronic migraine without aura, not intractable, without status migrainosus: Secondary | ICD-10-CM | POA: Insufficient documentation

## 2017-10-16 DIAGNOSIS — IMO0002 Reserved for concepts with insufficient information to code with codable children: Secondary | ICD-10-CM | POA: Insufficient documentation

## 2017-10-19 DIAGNOSIS — E039 Hypothyroidism, unspecified: Secondary | ICD-10-CM | POA: Insufficient documentation

## 2017-10-19 DIAGNOSIS — N951 Menopausal and female climacteric states: Secondary | ICD-10-CM | POA: Insufficient documentation

## 2017-10-21 ENCOUNTER — Encounter: Payer: Self-pay | Admitting: Cardiology

## 2017-10-21 ENCOUNTER — Ambulatory Visit: Payer: BC Managed Care – PPO | Admitting: Cardiology

## 2017-10-21 DIAGNOSIS — E782 Mixed hyperlipidemia: Secondary | ICD-10-CM | POA: Insufficient documentation

## 2017-10-21 DIAGNOSIS — R0789 Other chest pain: Secondary | ICD-10-CM | POA: Insufficient documentation

## 2017-10-21 NOTE — Patient Instructions (Signed)
Medication Instructions:  Your physician recommends that you continue on your current medications as directed. Please refer to the Current Medication list given to you today.  Labwork: None  Testing/Procedures: You had an EKG today.  Your physician has requested that you have a stress echocardiogram. For further information please visit [email or web address]. Please follow instruction sheet as given.  Follow-Up: Your physician recommends that you schedule a follow-up appointment in: 2 months.  Any Other Special Instructions Will Be Listed Below (If Applicable).     If you need a refill on your cardiac medications before your next appointment, please call your pharmacy.

## 2017-10-21 NOTE — Progress Notes (Signed)
Cardiology Office Note:    Date:  10/21/2017   ID:  Brittany Todd, DOB 06/28/1961, MRN [id number]  PCP:  Richmond CampbellKaplan, Kristen W., PA-C  Cardiologist:  Garwin Brothersajan R Mclain Freer, MD   Referring MD: Richmond CampbellKaplan, Kristen W., PA-C    ASSESSMENT:    1. Chest pain, atypical   2. Mixed dyslipidemia    PLAN:    In order of problems listed above:  1. Primary prevention stressed to the patient.  Importance of compliance with diet and medications stressed and she vocalized understanding.  Diet was discussed for dyslipidemia and for her overweight status.  I discussed this with her at length and she vocalized understanding and to act on it. 2. Her chest pain is atypical however in view of her risk factors and concerns we will do an exercise stress echo.  She knows to go to the nearest emergency room for any concerning symptoms. 3. Patient will be seen in follow-up appointment in 2 months or earlier if the patient has any concerns. 4.   Medication Adjustments/Labs and Tests Ordered: Current medicines are reviewed at length with the patient today.  Concerns regarding medicines are outlined above.  No orders of the defined types were placed in this encounter.  No orders of the defined types were placed in this encounter.    History of Present Illness:    Brittany Todd is a 57 y.o. female who is being seen today for the evaluation of chest pain at the request of Richmond CampbellKaplan, Kristen W., PA-C.  Patient is a pleasant 57 year old female.  She has past medical history of dyslipidemia and she says that she has family history of coronary artery disease.  She mentions to me that she has been having some chest tightness which is more related to exertion.  Sometimes she walks about 30 minutes without any problems.  No orthopnea or PND.  She is concerned about the symptoms and is here for evaluation.  No radiation of the symptoms to any part of the body.  No orthopnea PND or syncope.  At the time of my evaluation, the patient  is alert awake oriented and in no distress.  Her husband accompanies her for this visit.  Past Medical History:  Diagnosis Date  . ADD (attention deficit disorder)   . Anxiety   . Chest tightness   . DOE (dyspnea on exertion)   . GERD (gastroesophageal reflux disease)   . Hashimoto's disease   . Hyperlipemia   . Hypothyroid   . Migraines   . Renal stone   . TMJ (dislocation of temporomandibular joint)   . Vitamin D deficiency     Past Surgical History:  Procedure Laterality Date  . CESAREAN SECTION    . FINGER SURGERY Left   . TUBAL LIGATION      Current Medications: Current Meds  Medication Sig  . acyclovir (ZOVIRAX) 400 MG tablet Take 400 mg by mouth 3 (three) times daily as needed.  Marland Kitchen. amphetamine-dextroamphetamine (ADDERALL) 10 MG tablet Take 10 mg by mouth daily with breakfast.  . atorvastatin (LIPITOR) 20 MG tablet Take 20 mg by mouth at bedtime.  Marland Kitchen. b complex vitamins tablet Take 1 tablet by mouth daily.  . Cholecalciferol (VITAMIN D3 PO) Take 1,000 Int'l Units by mouth daily.  Marland Kitchen. escitalopram (LEXAPRO) 20 MG tablet Take 10 mg by mouth daily.  . Ginkgo Biloba 60 MG TABS Take 1 tablet by mouth daily.  Marland Kitchen. levothyroxine (SYNTHROID, LEVOTHROID) 88 MCG tablet Take 88 mcg by mouth  daily before breakfast.  . Multiple Minerals-Vitamins (CAL MAG ZINC +D3) TABS Take 1 tablet by mouth daily.  . Omega-3 Fatty Acids (FISH OIL OMEGA-3 PO) Take 2 capsules by mouth. Fish Oil 1200 mg per soft gel Omega 3, 600 mg per soft gel  . omeprazole (PRILOSEC OTC) 20 MG tablet Take 20 mg by mouth as needed.  . SUMAtriptan (IMITREX) 100 MG tablet Take 1 tablet (100 mg total) by mouth once as needed for up to 1 dose. May repeat in 2 hours if headache persists or recurs.  . Topiramate ER (TROKENDI XR) 50 MG CP24 Take 50 mg by mouth at bedtime.  . Topiramate ER (TROKENDI XR) 50 MG CP24 Take 50 mg by mouth at bedtime.     Allergies:   Codeine; Cymbalta [duloxetine hcl]; and Morphine and related    Social History   Socioeconomic History  . Marital status: Married    Spouse name: None  . Number of children: None  . Years of education: None  . Highest education level: None  Social Needs  . Financial resource strain: None  . Food insecurity - worry: None  . Food insecurity - inability: None  . Transportation needs - medical: None  . Transportation needs - non-medical: None  Occupational History  . None  Tobacco Use  . Smoking status: Never Smoker  . Smokeless tobacco: Never Used  Substance and Sexual Activity  . Alcohol use: No  . Drug use: No  . Sexual activity: None  Other Topics Concern  . None  Social History Narrative   Lives at home with her husband   1 glass of caffeine daily     Family History: The patient's family history includes Alzheimer's disease in her maternal grandmother; Heart attack in her father.  ROS:   Please see the history of present illness.    All other systems reviewed and are negative.  EKGs/Labs/Other Studies Reviewed:    The following studies were reviewed today: I reviewed office records.  EKG done today reveals sinus rhythm and nonspecific ST-T changes.   Recent Labs: No results found for requested labs within last 8760 hours.  Recent Lipid Panel No results found for: CHOL, TRIG, HDL, CHOLHDL, VLDL, LDLCALC, LDLDIRECT  Physical Exam:    VS:  BP 132/70 (BP Location: Left Arm, Patient Position: Sitting, Cuff Size: Normal)   Pulse 86   Ht 4\' 11"  (1.499 m)   Wt 178 lb (80.7 kg)   SpO2 98%   BMI 35.95 kg/m     Wt Readings from Last 3 Encounters:  10/21/17 178 lb (80.7 kg)  10/13/17 175 lb (79.4 kg)     GEN: Patient is in no acute distress HEENT: Normal NECK: No JVD; No carotid bruits LYMPHATICS: No lymphadenopathy CARDIAC: S1 S2 regular, 2/6 systolic murmur at the apex. RESPIRATORY:  Clear to auscultation without rales, wheezing or rhonchi  ABDOMEN: Soft, non-tender, non-distended MUSCULOSKELETAL:  No edema; No  deformity  SKIN: Warm and dry NEUROLOGIC:  Alert and oriented x 3 PSYCHIATRIC:  Normal affect    Signed, Garwin Brothers, MD  10/21/2017 4:40 PM    [facility]

## 2017-11-23 ENCOUNTER — Ambulatory Visit (HOSPITAL_BASED_OUTPATIENT_CLINIC_OR_DEPARTMENT_OTHER): Payer: BC Managed Care – PPO

## 2018-02-03 DIAGNOSIS — L600 Ingrowing nail: Secondary | ICD-10-CM | POA: Insufficient documentation

## 2018-02-21 ENCOUNTER — Ambulatory Visit: Payer: BC Managed Care – PPO | Admitting: Adult Health

## 2018-02-25 ENCOUNTER — Telehealth: Payer: Self-pay | Admitting: Neurology

## 2018-02-25 MED ORDER — TOPIRAMATE ER 50 MG PO CAP24: 50.0000 mg | ORAL_CAPSULE | Freq: Every day | ORAL | 11 refills | Status: DC

## 2018-02-25 NOTE — Telephone Encounter (Addendum)
Refill sent to pharmacy, receipt confirmed by pharmacy. Attempted to [facility] to see if they have Trokendi XR in stock, but pharmacy is closed. D/w pt. She will call office. Discussed that if they do not have it or cannot get it in today pt may be able to get a temporary sample from our office if we have them available. She verbalized appreciation. She is aware office closes at noon.

## 2018-02-25 NOTE — Telephone Encounter (Signed)
Pt requesting refills for Topiramate ER (TROKENDI XR) 50 MG CP24 sent to [facility]. Stating she has been out of medication for a week now. Pt scheduled f/u for 7/15

## 2018-03-01 ENCOUNTER — Ambulatory Visit: Payer: BC Managed Care – PPO | Admitting: Adult Health

## 2018-03-07 DIAGNOSIS — N201 Calculus of ureter: Secondary | ICD-10-CM | POA: Insufficient documentation

## 2018-03-14 ENCOUNTER — Ambulatory Visit: Payer: BC Managed Care – PPO | Admitting: Adult Health

## 2018-03-24 ENCOUNTER — Ambulatory Visit: Payer: BC Managed Care – PPO | Admitting: Adult Health

## 2018-10-03 ENCOUNTER — Encounter: Payer: Self-pay | Admitting: Pulmonary Disease

## 2018-10-03 ENCOUNTER — Ambulatory Visit: Payer: BC Managed Care – PPO | Admitting: Pulmonary Disease

## 2018-10-03 VITALS — BP 130/80 | HR 87 | Ht 59.0 in | Wt 186.4 lb

## 2018-10-03 DIAGNOSIS — G4733 Obstructive sleep apnea (adult) (pediatric): Secondary | ICD-10-CM | POA: Diagnosis not present

## 2018-10-03 NOTE — Patient Instructions (Signed)
Moderate probability of significant sleep disordered breathing  We will set you up for a home sleep study I will see you back in the office in about 3 months Continue to work on weight loss efforts  Elevation of the head of the bed/lateral sleep may help snoring Sleep Apnea Sleep apnea is a condition in which breathing pauses or becomes shallow during sleep. Episodes of sleep apnea usually last 10 seconds or longer, and they may occur as many as 20 times an hour. Sleep apnea disrupts your sleep and keeps your body from getting the rest that it needs. This condition can increase your risk of certain health problems, including:  Heart attack.  Stroke.  Obesity.  Diabetes.  Heart failure.  Irregular heartbeat. There are three kinds of sleep apnea:  Obstructive sleep apnea. This kind is caused by a blocked or collapsed airway.  Central sleep apnea. This kind happens when the part of the brain that controls breathing does not send the correct signals to the muscles that control breathing.  Mixed sleep apnea. This is a combination of obstructive and central sleep apnea. What are the causes? The most common cause of this condition is a collapsed or blocked airway. An airway can collapse or become blocked if:  Your throat muscles are abnormally relaxed.  Your tongue and tonsils are larger than normal.  You are overweight.  Your airway is smaller than normal. What increases the risk? This condition is more likely to develop in people who:  Are overweight.  Smoke.  Have a smaller than normal airway.  Are elderly.  Are female.  Drink alcohol.  Take sedatives or tranquilizers.  Have a family history of sleep apnea. What are the signs or symptoms? Symptoms of this condition include:  Trouble staying asleep.  Daytime sleepiness and tiredness.  Irritability.  Loud snoring.  Morning headaches.  Trouble concentrating.  Forgetfulness.  Decreased interest in  sex.  Unexplained sleepiness.  Mood swings.  Personality changes.  Feelings of depression.  Waking up often during the night to urinate.  Dry mouth.  Sore throat. How is this diagnosed? This condition may be diagnosed with:  A medical history.  A physical exam.  A series of tests that are done while you are sleeping (sleep study). These tests are usually done in a sleep lab, but they may also be done at home. How is this treated? Treatment for this condition aims to restore normal breathing and to ease symptoms during sleep. It may involve managing health issues that can affect breathing, such as high blood pressure or obesity. Treatment may include:  Sleeping on your side.  Using a decongestant if you have nasal congestion.  Avoiding the use of depressants, including alcohol, sedatives, and narcotics.  Losing weight if you are overweight.  Making changes to your diet.  Quitting smoking.  Using a device to open your airway while you sleep, such as: ? An oral appliance. This is a custom-made mouthpiece that shifts your lower jaw forward. ? A continuous positive airway pressure (CPAP) device. This device delivers oxygen to your airway through a mask. ? A nasal expiratory positive airway pressure (EPAP) device. This device has valves that you put into each nostril. ? A bi-level positive airway pressure (BPAP) device. This device delivers oxygen to your airway through a mask.  Surgery if other treatments do not work. During surgery, excess tissue is removed to create a wider airway. It is important to get treatment for sleep apnea. Without treatment,  this condition can lead to:  High blood pressure.  Coronary artery disease.  (Men) An inability to achieve or maintain an erection (impotence).  Reduced thinking abilities. Follow these instructions at home:  Make any lifestyle changes that your health care provider recommends.  Eat a healthy, well-balanced  diet.  Take over-the-counter and prescription medicines only as told by your health care provider.  Avoid using depressants, including alcohol, sedatives, and narcotics.  Take steps to lose weight if you are overweight.  If you were given a device to open your airway while you sleep, use it only as told by your health care provider.  Do not use any tobacco products, such as cigarettes, chewing tobacco, and e-cigarettes. If you need help quitting, ask your health care provider.  Keep all follow-up visits as told by your health care provider. This is important. Contact a health care provider if:  The device that you received to open your airway during sleep is uncomfortable or does not seem to be working.  Your symptoms do not improve.  Your symptoms get worse. Get help right away if:  You develop chest pain.  You develop shortness of breath.  You develop discomfort in your back, arms, or stomach.  You have trouble speaking.  You have weakness on one side of your body.  You have drooping in your face. These symptoms may represent a serious problem that is an emergency. Do not wait to see if the symptoms will go away. Get medical help right away. Call your local emergency services (911 in the [place] Do not drive yourself to the hospital. This information is not intended to replace advice given to you by your health care provider. Make sure you discuss any questions you have with your health care provider. Document Released: 08/07/2002 Document Revised: 03/15/2017 Document Reviewed: 05/27/2015 Elsevier Interactive Patient Education  2019 [employer].

## 2018-10-03 NOTE — Progress Notes (Addendum)
Subjective:    Patient ID: Brittany Todd, female    DOB: 28-Feb-1961, 58 y.o.   MRN: [id number]  Patient with a longstanding history of snoring   She has a longstanding history of snoring History of sore throats dryness of her mouth in the mornings Usually tries to go to bed between 12 and 1 AM, final wake up time between 730 and 8, wakes up 2-3 times during the night Takes about 10 minutes to fall asleep Has gained about 30 pounds recently  She has chronic headaches Dryness of the mouth in the mornings Memory is good No problems with focusing Her mom did snore   Never smoker No family history of obstructive sleep apnea  History of anxiety/ADHD   Review of Systems  Constitutional: Positive for unexpected weight change. Negative for fever.  HENT: Positive for congestion, ear pain and sinus pressure. Negative for dental problem, nosebleeds, postnasal drip, rhinorrhea, sneezing, sore throat and trouble swallowing.   Eyes: Negative for redness and itching.  Respiratory: Positive for shortness of breath. Negative for cough, chest tightness and wheezing.   Cardiovascular: Positive for palpitations. Negative for leg swelling.  Gastrointestinal: Negative for nausea and vomiting.  Genitourinary: Negative for dysuria.  Musculoskeletal: Negative for joint swelling.  Skin: Negative for rash.  Allergic/Immunologic: Negative.  Negative for environmental allergies, food allergies and immunocompromised state.  Neurological: Positive for headaches.  Hematological: Does not bruise/bleed easily.  Psychiatric/Behavioral: Negative for dysphoric mood. The patient is nervous/anxious.    Past Medical History:  Diagnosis Date  . ADD (attention deficit disorder)   . Anxiety   . Chest tightness   . DOE (dyspnea on exertion)   . GERD (gastroesophageal reflux disease)   . Hashimoto's disease   . Hyperlipemia   . Hypothyroid   . Migraines   . Renal stone   . TMJ (dislocation of  temporomandibular joint)   . Vitamin D deficiency    Family History  Problem Relation Age of Onset  . Heart attack Father   . Alzheimer's disease Maternal Grandmother       Objective:   Physical Exam Vitals signs reviewed.  Constitutional:      Appearance: Normal appearance.  HENT:     Head: Normocephalic and atraumatic.     Mouth/Throat:     Mouth: Mucous membranes are moist.     Comments: Mallampati 3, crowded oropharynx Eyes:     General:        Right eye: No discharge.        Left eye: No discharge.     Extraocular Movements: Extraocular movements intact.     Pupils: Pupils are equal, round, and reactive to light.  Neck:     Musculoskeletal: No neck rigidity.  Cardiovascular:     Rate and Rhythm: Normal rate and regular rhythm.     Heart sounds: No murmur.  Pulmonary:     Effort: Pulmonary effort is normal. No respiratory distress.     Breath sounds: Normal breath sounds. No stridor. No wheezing or rhonchi.  Abdominal:     General: There is no distension.     Palpations: Abdomen is soft.     Tenderness: There is no abdominal tenderness.  Musculoskeletal:        General: No swelling.  Skin:    General: Skin is warm and dry.  Neurological:     Mental Status: She is alert.    Results of the Epworth flowsheet 10/03/2018  Sitting and reading 2  Watching  TV 2  Sitting, inactive in a public place (e.g. a theatre or a meeting) 1  As a passenger in a car for an hour without a break 3  Lying down to rest in the afternoon when circumstances permit 3  Sitting and talking to someone 0  Sitting quietly after a lunch without alcohol 2  In a car, while stopped for a few minutes in traffic 0  Total score 13      Assessment & Plan:  .  High probability of significant sleep disordered breathing -Physical exam and symptoms suggest presence of sleep disordered breathing  .  Obesity  Plan: Pathophysiology of sleep disordered breathing discussed with the  patient  Treatment options discussed with the patient  We will set the patient up for a split-night study  I will see the patient back in the office in 3 months  Elevation of the head of the bed, lateral sleep discussed as it pertains to helping her snoring  Encouraged to call if she has any questions or concerns

## 2018-10-18 ENCOUNTER — Telehealth: Payer: Self-pay

## 2018-10-18 NOTE — Telephone Encounter (Signed)
NOTES ON FILE 

## 2018-11-20 ENCOUNTER — Encounter (HOSPITAL_BASED_OUTPATIENT_CLINIC_OR_DEPARTMENT_OTHER): Payer: BC Managed Care – PPO

## 2018-12-05 ENCOUNTER — Telehealth: Payer: Self-pay

## 2018-12-05 NOTE — Telephone Encounter (Signed)
Due to COVID 19 patient was called to see if they would like to be seen for a virtual visit tele or webex and patient declined and wanted to reschedule because no problems issues or concerns level 3

## 2018-12-14 ENCOUNTER — Telehealth: Payer: Self-pay | Admitting: *Deleted

## 2018-12-14 NOTE — Telephone Encounter (Signed)
Appt scheduled for August

## 2018-12-15 ENCOUNTER — Ambulatory Visit: Payer: BC Managed Care – PPO | Admitting: Cardiology

## 2019-01-06 ENCOUNTER — Ambulatory Visit: Payer: BC Managed Care – PPO | Admitting: Pulmonary Disease

## 2019-03-01 ENCOUNTER — Other Ambulatory Visit: Payer: Self-pay | Admitting: Neurology

## 2019-03-02 ENCOUNTER — Other Ambulatory Visit: Payer: Self-pay | Admitting: Neurology

## 2019-03-06 ENCOUNTER — Other Ambulatory Visit: Payer: Self-pay | Admitting: *Deleted

## 2019-03-06 MED ORDER — TROKENDI XR 50 MG PO CP24: 1.0000 | ORAL_CAPSULE | Freq: Every day | ORAL | 0 refills | Status: DC

## 2019-03-27 ENCOUNTER — Telehealth: Payer: Self-pay | Admitting: Cardiology

## 2019-03-27 DIAGNOSIS — R0602 Shortness of breath: Secondary | ICD-10-CM

## 2019-03-27 DIAGNOSIS — R0789 Other chest pain: Secondary | ICD-10-CM

## 2019-03-27 NOTE — Telephone Encounter (Signed)
Patient called stating she has called several times trying to figure out what she needs to do but no one has called her back.  She was scheduled for a stress test which was cancelled due to [place]. She wants to know if she needs to have any tests done before her 8/5 appt.  Please call patient today to discuss

## 2019-03-27 NOTE — Telephone Encounter (Signed)
Patient was scheduled for exercise stress echo prior to CVD19. Informed that this test is still not open but she will be in to see Dr. Docia Furl on 04/05/19  To evaluate SOB on exertion. No chest pain, lightheadness or n/v. Advised to seek immediate medical attention, if any of her symptoms change.

## 2019-03-28 NOTE — Telephone Encounter (Signed)
Set her up for [place] and move appt to after this test

## 2019-03-28 NOTE — Telephone Encounter (Signed)
Call patient to discuss lexiscan and rescheduled f/u. Letter to be mailed.

## 2019-04-05 ENCOUNTER — Ambulatory Visit: Payer: BC Managed Care – PPO | Admitting: Cardiology

## 2019-04-06 ENCOUNTER — Telehealth: Payer: Self-pay | Admitting: Neurology

## 2019-04-06 NOTE — Telephone Encounter (Signed)
I spoke with the patient and advised we require yearly appts for medication refills. She was concerned about the charge she receives each time she sees a specialist. I advised if she is doing well and would like to see if her primary care would be willing to follow up and prescribe her Trokendi that would be fine. Pt was very appreciative and will look into this. She will call back if they are not willing to take over.

## 2019-04-06 NOTE — Telephone Encounter (Signed)
Pt states that she only got a 30 day supply of her Topiramate ER (TROKENDI XR) 50 MG CP24 and it was to be a 90 day supply. Please advise.

## 2019-04-06 NOTE — Telephone Encounter (Signed)
Appt needed for further refills. Pt last seen 10/2017.

## 2019-04-10 ENCOUNTER — Ambulatory Visit: Payer: BC Managed Care – PPO | Admitting: Cardiology

## 2019-04-24 ENCOUNTER — Telehealth: Payer: Self-pay | Admitting: Cardiology

## 2019-04-24 NOTE — Telephone Encounter (Signed)
Patient Is scheduled for Myoview and she is wanting to check to make sure that it is OK for her to do?? It is a blood disorder and she clots a lot. Please check.

## 2019-04-25 ENCOUNTER — Telehealth (HOSPITAL_COMMUNITY): Payer: Self-pay | Admitting: *Deleted

## 2019-04-25 NOTE — Telephone Encounter (Signed)
Patient given detailed instructions per Myocardial Perfusion Study Information Sheet for the test on 05/02/19 Patient notified to arrive 15 minutes early and that it is imperative to arrive on time for appointment to keep from having the test rescheduled.  If you need to cancel or reschedule your appointment, please call the office within 24 hours of your appointment. . Patient verbalized understanding. Harmani Neto Jacqueline    

## 2019-04-26 NOTE — Telephone Encounter (Signed)
Yes the Myoview has no connection with any blood clots

## 2019-04-27 NOTE — Telephone Encounter (Signed)
Spent 15 minutes discussing procedure and answering questions. Patient is clear on upcoming procedure and follow up.

## 2019-05-02 ENCOUNTER — Other Ambulatory Visit: Payer: Self-pay

## 2019-05-02 ENCOUNTER — Ambulatory Visit (INDEPENDENT_AMBULATORY_CARE_PROVIDER_SITE_OTHER): Payer: BC Managed Care – PPO

## 2019-05-02 DIAGNOSIS — R0602 Shortness of breath: Secondary | ICD-10-CM | POA: Diagnosis not present

## 2019-05-02 LAB — MYOCARDIAL PERFUSION IMAGING
LV dias vol: 55 mL (ref 46–106)
LV sys vol: 11 mL
Peak HR: 99 {beats}/min
Rest HR: 69 {beats}/min
SDS: 0
SRS: 0
SSS: 0
TID: 1.29

## 2019-05-02 MED ORDER — REGADENOSON 0.4 MG/5ML IV SOLN
0.4000 mg | Freq: Once | INTRAVENOUS | Status: AC
  Administered 2019-05-02: 0.4 mg via INTRAVENOUS

## 2019-05-02 MED ORDER — TECHNETIUM TC 99M TETROFOSMIN IV KIT
32.7000 | PACK | Freq: Once | INTRAVENOUS | Status: AC | PRN
  Administered 2019-05-02: 32.7 via INTRAVENOUS

## 2019-05-02 MED ORDER — TECHNETIUM TC 99M TETROFOSMIN IV KIT
10.6000 | PACK | Freq: Once | INTRAVENOUS | Status: AC | PRN
  Administered 2019-05-02: 10.6 via INTRAVENOUS

## 2019-05-03 ENCOUNTER — Encounter: Payer: Self-pay | Admitting: *Deleted

## 2019-05-04 ENCOUNTER — Ambulatory Visit: Payer: BC Managed Care – PPO | Admitting: Cardiology

## 2019-05-05 ENCOUNTER — Telehealth: Payer: Self-pay | Admitting: Cardiology

## 2019-05-05 NOTE — Telephone Encounter (Signed)
Please call patient to discuss appt

## 2019-05-11 NOTE — Telephone Encounter (Signed)
Patient called wanting to schedule f/u appt.

## 2019-06-29 ENCOUNTER — Other Ambulatory Visit: Payer: Self-pay

## 2019-06-29 ENCOUNTER — Telehealth (INDEPENDENT_AMBULATORY_CARE_PROVIDER_SITE_OTHER): Payer: BC Managed Care – PPO | Admitting: Cardiology

## 2019-06-29 ENCOUNTER — Encounter: Payer: Self-pay | Admitting: Cardiology

## 2019-06-29 VITALS — Ht 59.0 in | Wt 180.0 lb

## 2019-06-29 DIAGNOSIS — E782 Mixed hyperlipidemia: Secondary | ICD-10-CM

## 2019-06-29 DIAGNOSIS — R0789 Other chest pain: Secondary | ICD-10-CM | POA: Diagnosis not present

## 2019-06-29 NOTE — Patient Instructions (Signed)

## 2019-06-29 NOTE — Progress Notes (Signed)
Virtual Visit via Video Note   This visit type was conducted due to national recommendations for restrictions regarding the COVID-19 Pandemic (e.g. social distancing) in an effort to limit this patient's exposure and mitigate transmission in our community.  Due to her co-morbid illnesses, this patient is at least at moderate risk for complications without adequate follow up.  This format is felt to be most appropriate for this patient at this time.  All issues noted in this document were discussed and addressed.  A limited physical exam was performed with this format.  Please refer to the patient's chart for her consent to telehealth for [facility].   Date:  06/29/2019   ID:  Ernest Haber, DOB 07-04-1961, MRN [id number]  Patient Location: Home Provider Location: Office  PCP:  Aletha Halim., PA-C  Cardiologist:  No primary care provider on file.  Electrophysiologist:  None   Evaluation Performed:  Follow-Up Visit  Chief Complaint: Essential hypertension  History of Present Illness:    MICHEL ESKELSON is a 58 y.o. female with past medical history of essential hypertension dyslipidemia and obesity.  She mentions to me that she is feeling better now.  Her stress test did not reveal any evidence of ischemia.  She leads a very sedentary lifestyle and has been lax with her diet.  At the time of my evaluation, the patient is alert awake oriented and in no distress.  The patient does not have symptoms concerning for COVID-19 infection (fever, chills, cough, or new shortness of breath).    Past Medical History:  Diagnosis Date  . ADD (attention deficit disorder)   . Anxiety   . Chest tightness   . DOE (dyspnea on exertion)   . GERD (gastroesophageal reflux disease)   . Hashimoto's disease   . Hyperlipemia   . Hypothyroid   . Migraines   . Renal stone   . TMJ (dislocation of temporomandibular joint)   . Vitamin D deficiency    Past Surgical History:  Procedure  Laterality Date  . CESAREAN SECTION    . FINGER SURGERY Left   . TUBAL LIGATION       No outpatient medications have been marked as taking for the 06/29/19 encounter (Telemedicine) with Dailee Manalang, Reita Cliche, MD.     Allergies:   Codeine, Cymbalta [duloxetine hcl], and Morphine and related   Social History   Tobacco Use  . Smoking status: Never Smoker  . Smokeless tobacco: Never Used  Substance Use Topics  . Alcohol use: No  . Drug use: No     Family Hx: The patient's family history includes Alzheimer's disease in her maternal grandmother; Heart attack in her father.  ROS:   Please see the history of present illness.    As mentioned above All other systems reviewed and are negative.   Prior CV studies:   The following studies were reviewed today:  Stress test report was discussed with the patient at length  Labs/Other Tests and Data Reviewed:    EKG:  No ECG reviewed.  Recent Labs: No results found for requested labs within last 8760 hours.   Recent Lipid Panel No results found for: CHOL, TRIG, HDL, CHOLHDL, LDLCALC, LDLDIRECT  Wt Readings from Last 3 Encounters:  06/29/19 180 lb (81.6 kg)  05/02/19 185 lb (83.9 kg)  10/03/18 186 lb 6.4 oz (84.6 kg)     Objective:    Vital Signs:  Ht 4\' 11"  (1.499 m)   Wt 180 lb (81.6 kg)  BMI 36.36 kg/m    VITAL SIGNS:  reviewed  ASSESSMENT & PLAN:    1. Essential hypertension: Blood pressure stable.  She is taking losartan 50 mg daily and blood pressure issues are managed by primary care physician diet including salt intake issues and lifestyle modification and exercise was stressed to the patient.  I advised her to walk at least half an hour a day 5 days a week and she promises to do so. 2. Mixed dyslipidemia: Diet was discussed and she promises to do better with diet and exercise 3. Patient will be seen in follow-up appointment in 6 months or earlier if the patient has any concerns   COVID-19 Education: The signs  and symptoms of COVID-19 were discussed with the patient and how to seek care for testing (follow up with PCP or arrange E-visit).  The importance of social distancing was discussed today.  Time:   Today, I have spent 15 minutes with the patient with telehealth technology discussing the above problems.     Medication Adjustments/Labs and Tests Ordered: Current medicines are reviewed at length with the patient today.  Concerns regarding medicines are outlined above.   Tests Ordered: No orders of the defined types were placed in this encounter.   Medication Changes: No orders of the defined types were placed in this encounter.   Follow Up:  Either In Person or Virtual in 6 month(s)  Signed, Garwin Brothers, MD  06/29/2019 11:08 AM    [facility]

## 2020-03-13 ENCOUNTER — Other Ambulatory Visit: Payer: Self-pay | Admitting: Neurology

## 2021-04-15 ENCOUNTER — Other Ambulatory Visit: Payer: Self-pay | Admitting: Obstetrics and Gynecology

## 2021-04-15 DIAGNOSIS — Z1231 Encounter for screening mammogram for malignant neoplasm of breast: Secondary | ICD-10-CM

## 2021-04-17 ENCOUNTER — Ambulatory Visit
Admission: RE | Admit: 2021-04-17 | Discharge: 2021-04-17 | Disposition: A | Discharge status: Home / Self Care | Payer: BC Managed Care – PPO | Source: Ambulatory Visit | Attending: Obstetrics and Gynecology | Admitting: Obstetrics and Gynecology

## 2021-04-17 ENCOUNTER — Other Ambulatory Visit: Payer: Self-pay

## 2021-04-17 DIAGNOSIS — Z1231 Encounter for screening mammogram for malignant neoplasm of breast: Secondary | ICD-10-CM

## 2021-04-18 ENCOUNTER — Other Ambulatory Visit (HOSPITAL_COMMUNITY): Payer: Self-pay | Admitting: Orthopedic Surgery

## 2021-04-18 ENCOUNTER — Encounter (HOSPITAL_COMMUNITY): Payer: BC Managed Care – PPO

## 2021-04-18 ENCOUNTER — Ambulatory Visit (HOSPITAL_COMMUNITY)
Admission: RE | Admit: 2021-04-18 | Discharge: 2021-04-18 | Disposition: A | Discharge status: Home / Self Care | Payer: BC Managed Care – PPO | Source: Ambulatory Visit | Attending: Cardiovascular Disease | Admitting: Cardiovascular Disease

## 2021-04-18 DIAGNOSIS — M7989 Other specified soft tissue disorders: Secondary | ICD-10-CM | POA: Insufficient documentation

## 2021-04-18 DIAGNOSIS — M79604 Pain in right leg: Secondary | ICD-10-CM | POA: Diagnosis not present

## 2021-06-17 ENCOUNTER — Other Ambulatory Visit: Payer: Self-pay

## 2021-06-17 ENCOUNTER — Encounter: Payer: Self-pay | Admitting: Pulmonary Disease

## 2021-06-17 ENCOUNTER — Ambulatory Visit (INDEPENDENT_AMBULATORY_CARE_PROVIDER_SITE_OTHER): Payer: BC Managed Care – PPO | Admitting: Pulmonary Disease

## 2021-06-17 VITALS — BP 130/78 | HR 81 | Temp 97.9°F | Ht 59.0 in | Wt 195.0 lb

## 2021-06-17 DIAGNOSIS — G4733 Obstructive sleep apnea (adult) (pediatric): Secondary | ICD-10-CM

## 2021-06-17 NOTE — Progress Notes (Signed)
Subjective:    Patient ID: Brittany Todd, female    DOB: 04-19-1961, 60 y.o.   MRN: [id number]  Patient with a longstanding history of snoring   She has a longstanding history of snoring History of sore throats dryness of her mouth in the mornings Usually tries to go to bed between 12 and 1 AM, final wake up time between 730 and 8, wakes up 2-3 times during the night Takes about 10 minutes to fall asleep Has gained about 30 pounds recently  She has chronic headaches Dryness of the mouth in the mornings Memory is good No problems with focusing Her mom did snore   Never smoker No family history of obstructive sleep apnea  History of anxiety/ADHD   Review of Systems  Constitutional:  Positive for unexpected weight change. Negative for fever.  HENT:  Positive for congestion, ear pain and sinus pressure. Negative for dental problem, nosebleeds, postnasal drip, rhinorrhea, sneezing, sore throat and trouble swallowing.   Eyes:  Negative for redness and itching.  Respiratory:  Positive for shortness of breath. Negative for cough, chest tightness and wheezing.   Cardiovascular:  Positive for palpitations. Negative for leg swelling.  Gastrointestinal:  Negative for nausea and vomiting.  Genitourinary:  Negative for dysuria.  Musculoskeletal:  Negative for joint swelling.  Skin:  Negative for rash.  Allergic/Immunologic: Negative.  Negative for environmental allergies, food allergies and immunocompromised state.  Neurological:  Positive for headaches.  Hematological:  Does not bruise/bleed easily.  Psychiatric/Behavioral:  Negative for dysphoric mood. The patient is nervous/anxious.   Past Medical History:  Diagnosis Date   ADD (attention deficit disorder)    Anxiety    Chest tightness    DOE (dyspnea on exertion)    GERD (gastroesophageal reflux disease)    Hashimoto's disease    Hyperlipemia    Hypothyroid    Migraines    Renal stone    TMJ (dislocation of  temporomandibular joint)    Vitamin D deficiency    Family History  Problem Relation Age of Onset   Heart attack Father    Alzheimer's disease Maternal Grandmother       Objective:   Physical Exam Vitals reviewed.  Constitutional:      Appearance: Normal appearance.  HENT:     Head: Normocephalic and atraumatic.     Mouth/Throat:     Mouth: Mucous membranes are moist.     Comments: Mallampati 3, crowded oropharynx Eyes:     General:        Right eye: No discharge.        Left eye: No discharge.     Extraocular Movements: Extraocular movements intact.     Pupils: Pupils are equal, round, and reactive to light.  Cardiovascular:     Rate and Rhythm: Normal rate and regular rhythm.     Heart sounds: No murmur heard. Pulmonary:     Effort: Pulmonary effort is normal. No respiratory distress.     Breath sounds: Normal breath sounds. No stridor. No wheezing or rhonchi.  Abdominal:     General: There is no distension.     Palpations: Abdomen is soft.     Tenderness: There is no abdominal tenderness.  Musculoskeletal:        General: No swelling.     Cervical back: No rigidity.  Skin:    General: Skin is warm and dry.  Neurological:     Mental Status: She is alert.   Results of the Epworth flowsheet 10/03/2018  Sitting and reading 2  Watching TV 2  Sitting, inactive in a public place (e.g. a theatre or a meeting) 1  As a passenger in a car for an hour without a break 3  Lying down to rest in the afternoon when circumstances permit 3  Sitting and talking to someone 0  Sitting quietly after a lunch without alcohol 2  In a car, while stopped for a few minutes in traffic 0  Total score 13      Assessment & Plan:  .  High probability of significant sleep disordered breathing -Physical exam and symptoms suggest presence of sleep disordered breathing  .  Obesity  .  History of hypothyroidism  Pathophysiology of sleep disordered breathing discussed with the  patient  Treatment options discussed with the patient my just needs  Plan:  We will set the patient up for a split-night study  I will see the patient back in the office in 3 months  Elevation of the head of the bed, lateral sleep discussed as it pertains to helping her snoring  Encouraged to call if she has any questions or concerns

## 2021-06-17 NOTE — Patient Instructions (Signed)
Moderate probability of significant obstructive sleep apnea  We will schedule you for home sleep study Update you with results as soon as reviewed  Treatment options as we discussed  Tentative follow-up in 4 to 5 months  Sleep Apnea Sleep apnea affects breathing during sleep. It causes breathing to stop for 10 seconds or more, or to become shallow. People with sleep apnea usually snore loudly. It can also increase the risk of: Heart attack. Stroke. Being very overweight (obese). Diabetes. Heart failure. Irregular heartbeat. High blood pressure. The goal of treatment is to help you breathe normally again. What are the causes? The most common cause of this condition is a collapsed or blocked airway. There are three kinds of sleep apnea: Obstructive sleep apnea. This is caused by a blocked or collapsed airway. Central sleep apnea. This happens when the brain does not send the right signals to the muscles that control breathing. Mixed sleep apnea. This is a combination of obstructive and central sleep apnea. What increases the risk? Being overweight. Smoking. Having a small airway. Being older. Being female. Drinking alcohol. Taking medicines to calm yourself (sedatives or tranquilizers). Having family members with the condition. Having a tongue or tonsils that are larger than normal. What are the signs or symptoms? Trouble staying asleep. Loud snoring. Headaches in the morning. Waking up gasping. Dry mouth or sore throat in the morning. Being sleepy or tired during the day. If you are sleepy or tired during the day, you may also: Not be able to focus your mind (concentrate). Forget things. Get angry a lot and have mood swings. Feel sad (depressed). Have changes in your personality. Have less interest in sex, if you are female. Be unable to have an erection, if you are female. How is this treated?  Sleeping on your side. Using a medicine to get rid of mucus in your nose  (decongestant). Avoiding the use of alcohol, medicines to help you relax, or certain pain medicines (narcotics). Losing weight, if needed. Changing your diet. Quitting smoking. Using a machine to open your airway while you sleep, such as: An oral appliance. This is a mouthpiece that shifts your lower jaw forward. A CPAP device. This device blows air through a mask when you breathe out (exhale). An EPAP device. This has valves that you put in each nostril. A BPAP device. This device blows air through a mask when you breathe in (inhale) and breathe out. Having surgery if other treatments do not work. Follow these instructions at home: Lifestyle Make changes that your doctor recommends. Eat a healthy diet. Lose weight if needed. Avoid alcohol, medicines to help you relax, and some pain medicines. Do not smoke or use any products that contain nicotine or tobacco. If you need help quitting, ask your doctor. General instructions Take over-the-counter and prescription medicines only as told by your doctor. If you were given a machine to use while you sleep, use it only as told by your doctor. If you are having surgery, make sure to tell your doctor you have sleep apnea. You may need to bring your device with you. Keep all follow-up visits. Contact a doctor if: The machine that you were given to use during sleep bothers you or does not seem to be working. You do not get better. You get worse. Get help right away if: Your chest hurts. You have trouble breathing in enough air. You have an uncomfortable feeling in your back, arms, or stomach. You have trouble talking. One side of  your body feels weak. A part of your face is hanging down. These symptoms may be an emergency. Get help right away. Call your local emergency services (911 in the [place] Do not wait to see if the symptoms will go away. Do not drive yourself to the hospital. Summary This condition affects breathing during  sleep. The most common cause is a collapsed or blocked airway. The goal of treatment is to help you breathe normally while you sleep. This information is not intended to replace advice given to you by your health care provider. Make sure you discuss any questions you have with your health care provider. Document Revised: 07/26/2020 Document Reviewed: 07/26/2020 Elsevier Patient Education  2022 [employer].

## 2021-08-26 ENCOUNTER — Other Ambulatory Visit: Payer: Self-pay | Admitting: Orthopedic Surgery

## 2021-08-26 DIAGNOSIS — M5136 Other intervertebral disc degeneration, lumbar region: Secondary | ICD-10-CM

## 2021-08-29 ENCOUNTER — Other Ambulatory Visit: Payer: Self-pay

## 2021-08-29 ENCOUNTER — Ambulatory Visit
Admission: RE | Admit: 2021-08-29 | Discharge: 2021-08-29 | Disposition: A | Discharge status: Home / Self Care | Payer: BC Managed Care – PPO | Source: Ambulatory Visit | Attending: Orthopedic Surgery | Admitting: Orthopedic Surgery

## 2021-08-29 DIAGNOSIS — M5136 Other intervertebral disc degeneration, lumbar region: Secondary | ICD-10-CM

## 2021-08-29 MED ORDER — METHYLPREDNISOLONE ACETATE 40 MG/ML INJ SUSP (RADIOLOG
80.0000 mg | Freq: Once | INTRAMUSCULAR | Status: AC
  Administered 2021-08-29: 80 mg via EPIDURAL

## 2021-08-29 MED ORDER — IOPAMIDOL (ISOVUE-M 200) INJECTION 41%
1.0000 mL | Freq: Once | INTRAMUSCULAR | Status: AC
  Administered 2021-08-29: 1 mL via EPIDURAL

## 2021-08-29 NOTE — Discharge Instructions (Signed)

## 2021-10-03 ENCOUNTER — Other Ambulatory Visit: Payer: Self-pay

## 2021-10-03 ENCOUNTER — Ambulatory Visit: Payer: BC Managed Care – PPO

## 2021-10-03 DIAGNOSIS — G4733 Obstructive sleep apnea (adult) (pediatric): Secondary | ICD-10-CM | POA: Diagnosis not present

## 2021-10-21 ENCOUNTER — Telehealth: Payer: Self-pay | Admitting: Pulmonary Disease

## 2021-10-21 DIAGNOSIS — G4733 Obstructive sleep apnea (adult) (pediatric): Secondary | ICD-10-CM

## 2021-10-21 NOTE — Telephone Encounter (Signed)
[place] pt HST showed severe  OSA with AHI 59/ hr Suggest autoCPAP  5-15 cm, mask of choice Also Schedule CPAP titration study to ensure no oxygen required  FU OV with APP in 6 wks after starting

## 2021-10-23 NOTE — Telephone Encounter (Signed)
ATC pt in regards to HST results. Will try again later.

## 2021-10-23 NOTE — Telephone Encounter (Signed)
Patient is checking on results of home sleep test. Patient phone number is [phone].

## 2021-10-24 NOTE — Telephone Encounter (Signed)
Called and spoke with patient to go over HST results. Patient expressed understanding and said that night she did sleep study was a rough night and that she had a lot of interruptions. Advised patient of her AHI and explained the cpap titration study to her. Order has been placed for CPAP set up and titration study. Advised her that they would call her to get her scheduled and also that DME would call her about machine. Nothing further needed at this time.

## 2021-11-03 ENCOUNTER — Telehealth: Payer: Self-pay | Admitting: Pulmonary Disease

## 2021-11-04 NOTE — Telephone Encounter (Signed)
Spoke to patient. ?She stated that she received her cpap machine today.  She canceled cpap titration.  ?Nothing further needed.  ?

## 2021-11-04 NOTE — Telephone Encounter (Signed)
ATC x1.  No answer, LVM to return call.  It looks like it was just ordered on 10/24/21 and it was sent to [facility].  Her CPAP titration is scheduled for 11/30/21 at 8 pm.  Will await return call. ?

## 2021-11-30 ENCOUNTER — Encounter (HOSPITAL_BASED_OUTPATIENT_CLINIC_OR_DEPARTMENT_OTHER): Payer: BC Managed Care – PPO | Admitting: Pulmonary Disease

## 2021-12-03 ENCOUNTER — Telehealth: Payer: Self-pay | Admitting: Pulmonary Disease

## 2021-12-03 NOTE — Telephone Encounter (Signed)
Called and spoke with patient. She stated that she is really struggling with her cpap machine. When she first received the machine, she was using a full face mask. She was able to switch over to the nasal pillows and she is still uncomfortable at night. She woke up this morning gasping for air. She had to use her albuterol inhaler in order for her to calm down her breathing to be able to go back to sleep.  ? ?She wanted to see if she has any other options for OSA. I explained to her about the [facility] device as well as the oral appliance. She is interested in the oral appliance.  ? ?She is aware that AO is working nights this week. I have printed a copy of her download for AO to review once he returns back to office.  ? ?AO, can you please advise? Thanks!  ?

## 2021-12-03 NOTE — Telephone Encounter (Signed)
Called and spoke with patient. She verbalized understanding. I attempted to move her appointment sooner to discuss the options but she did not want to move the appt.  ? ?While on the phone, she stated that the night she did the HST, she had her granddaughter with her. Her granddaughter is 63 years old and she was up multiple times during the night. She did not rest good.  ? ?Because of this, she wants to know if its possible to repeat the HST.  ? ?AO, please advise. Thanks!  ?

## 2021-12-03 NOTE — Telephone Encounter (Signed)
Probably best to have patient come into the office to further discuss options of care ? ?Oral appliances are not usually very good for this severity of obstructive sleep apnea-they may be used if there are no other choices, also have to accept that the sleep apnea may be undertreated ? ?An inspire device will be an option of treatment ?-We will need referral to ENT Dr. Janet Berlin for severe obstructive sleep apnea/ failed CPAP treatment. ? ?Offer patient a follow-up appointment myself or APP ?

## 2021-12-09 NOTE — Telephone Encounter (Signed)
Okay to schedule repeat home sleep study if she believes the previous was not optimal ? ?This will only reconfirm the diagnosis , does not move [place] forward with treatment. ?The home sleep studies are not usually false positive. ?

## 2021-12-09 NOTE — Telephone Encounter (Signed)
Routing back to Dr. Val Eagle as we are still waiting for response. ?

## 2021-12-09 NOTE — Telephone Encounter (Signed)
ATC patient, she answered but was talking to someone else in the background. Will call back later.  ?

## 2021-12-23 ENCOUNTER — Encounter: Payer: Self-pay | Admitting: Pulmonary Disease

## 2021-12-23 ENCOUNTER — Ambulatory Visit: Payer: BC Managed Care – PPO | Admitting: Pulmonary Disease

## 2021-12-23 VITALS — BP 148/88 | HR 91 | Temp 98.5°F | Ht 59.0 in | Wt 195.2 lb

## 2021-12-23 DIAGNOSIS — Z6839 Body mass index (BMI) 39.0-39.9, adult: Secondary | ICD-10-CM | POA: Diagnosis not present

## 2021-12-23 DIAGNOSIS — G4733 Obstructive sleep apnea (adult) (pediatric): Secondary | ICD-10-CM

## 2021-12-23 NOTE — Assessment & Plan Note (Signed)
We discussed close correlation between OSA and weight.  Her BMI is currently 21 and she may not qualify for hypoglossal nerve stimulator she has been started on Ozempic and is hopeful of losing some weight ?

## 2021-12-23 NOTE — Patient Instructions (Signed)
? ?  X split night study ?

## 2021-12-23 NOTE — Assessment & Plan Note (Signed)
We reviewed home sleep test which showed severe OSA and severe desaturations.  She was not excepting of the study.  Unfortunately she has not been able to tolerate CPAP therapy ?We will reschedule her study as a split-night, this will provide baseline data is also provided opportunity for CPAP titration and see if we can desensitize her as well as fit her with a better fitting mask and troubleshoot her problems. ? ?We discussed alternatives to CPAP therapy unfortunately dental appliance would be suboptimal.  We can consider hypoglossal nerve stimulator implant if she is absolutely unable to tolerate CPAP therapy. ?Weight loss encouraged  ?

## 2021-12-23 NOTE — Progress Notes (Signed)
? ?  Subjective:  ? ? Patient ID: Brittany Todd, female    DOB: 04/25/1961, 61 y.o.   MRN: [id number] ? ?HPI ? ?61 year old for follow-up of OSA ? ?PMH -hypothyroid, hypertension ?ADHD on Adderall ? ?She was seen by my partner Dr. Aldean Ast for her initial office visit.  She presented with snoring and witnessed apneas by her husband.  In his absence I reviewed her home sleep test which showed severe OSA.  For some reason, follow-up was arranged with me ?We reviewed the test today , she feels that the night of her home sleep test was uninterrupted sleep, she slept on the couch and her grandkids slept with her.  She wonders if this would have affected the study. ?She was set up with a CPAP device, she felt that the full facemask was suffocating her, she changed to nasal pillows but could not adjust to this.  She feels that she would not be adjust to CPAP therapy and would like to discuss alternatives. ? ?Meanwhile her PCP is started her on Ozempic for weight loss and she is hopeful. ?She has a right TKR planned has not received a date yet ? ?Significant tests/ events reviewed ? ?10/2021 HST showed severe  OSA with AHI 59/ hr , lowest desaturation 63%, TST 470 minutes ? ? ?Review of Systems ?neg for any significant sore throat, dysphagia, itching, sneezing, nasal congestion or excess/ purulent secretions, fever, chills, sweats, unintended wt loss, pleuritic or exertional cp, hempoptysis, orthopnea pnd or change in chronic leg swelling. Also denies presyncope, palpitations, heartburn, abdominal pain, nausea, vomiting, diarrhea or change in bowel or urinary habits, dysuria,hematuria, rash, arthralgias, visual complaints, headache, numbness weakness or ataxia. ? ?   ?Objective:  ? Physical Exam ? ?Gen. Pleasant, obese, in no distress ?ENT - no lesions, no post nasal drip ?Neck: No JVD, no thyromegaly, no carotid bruits ?Lungs: no use of accessory muscles, no dullness to percussion, decreased without rales or rhonchi   ?Cardiovascular: Rhythm regular, heart sounds  normal, no murmurs or gallops, no peripheral edema ?Musculoskeletal: No deformities, no cyanosis or clubbing , no tremors ? ? ? ?   ?Assessment & Plan:  ? ? ?

## 2022-01-08 ENCOUNTER — Ambulatory Visit (HOSPITAL_BASED_OUTPATIENT_CLINIC_OR_DEPARTMENT_OTHER): Payer: BC Managed Care – PPO | Attending: Pulmonary Disease | Admitting: Pulmonary Disease

## 2022-01-08 ENCOUNTER — Ambulatory Visit: Payer: BC Managed Care – PPO | Admitting: Pulmonary Disease

## 2022-01-08 DIAGNOSIS — R0683 Snoring: Secondary | ICD-10-CM | POA: Diagnosis not present

## 2022-01-08 DIAGNOSIS — G4736 Sleep related hypoventilation in conditions classified elsewhere: Secondary | ICD-10-CM | POA: Insufficient documentation

## 2022-01-08 DIAGNOSIS — G4733 Obstructive sleep apnea (adult) (pediatric): Secondary | ICD-10-CM | POA: Insufficient documentation

## 2022-01-12 ENCOUNTER — Telehealth: Payer: Self-pay | Admitting: Pulmonary Disease

## 2022-01-12 NOTE — Telephone Encounter (Signed)
Fax received from Dr. Paralee Cancel with [facility] to perform a Right total knee arthroplasty on patient.  Patient needs surgery clearance. Patient was seen on 12/23/2021. Office protocol is a risk assessment can be sent to surgeon if patient has been seen in 60 days or less.  ? ?Sending to Dr. Elsworth Soho for risk assessment or recommendations if patient needs to be seen in office prior to surgical procedure.   ?

## 2022-01-13 ENCOUNTER — Telehealth: Payer: Self-pay | Admitting: Pulmonary Disease

## 2022-01-13 NOTE — Telephone Encounter (Signed)
Called patient and she would like the results of her home sleep study that she had done. ? ?Dr Vassie Loll please advise  ?

## 2022-01-16 DIAGNOSIS — G4733 Obstructive sleep apnea (adult) (pediatric): Secondary | ICD-10-CM | POA: Diagnosis not present

## 2022-01-16 NOTE — Telephone Encounter (Signed)
Called and left voice mail for patient to call office back in regards to home sleep study test results.

## 2022-01-16 NOTE — Telephone Encounter (Signed)
OV notes and clearance form have been faxed back to [facility]. Nothing further needed at this time. ?

## 2022-01-16 NOTE — Procedures (Signed)
Patient Name: Brittany Todd, Brittany Todd Date: 01/08/2022 Gender: Female D.O.B: 04-06-1961 Age (years): 34 Referring Provider: Kara Mead MD, ABSM Height (inches): 52 Interpreting Physician: Kara Mead MD, ABSM Weight (lbs): 185 RPSGT: Baxter Flattery BMI: 37 MRN: [id number] Neck Size: 16.00 <br> <br> CLINICAL INFORMATION Sleep Study Type: NPSG    Indication for sleep study: Obesity, Snoring  10/2021 HST showed severe  OSA with AHI 59/ hr , lowest desaturation 63%, TST 470 minutes    Epworth Sleepiness Score: 6    SLEEP STUDY TECHNIQUE As per the AASM Manual for the Scoring of Sleep and Associated Events v2.3 (April 2016) with a hypopnea requiring 4% desaturations.  The channels recorded and monitored were frontal, central and occipital EEG, electrooculogram (EOG), submentalis EMG (chin), nasal and oral airflow, thoracic and abdominal wall motion, anterior tibialis EMG, snore microphone, electrocardiogram, and pulse oximetry.  MEDICATIONS Medications self-administered by patient taken the night of the study : ZONISAMIDE, Rosuvastatin  SLEEP ARCHITECTURE The study was initiated at 11:11:02 PM and ended at 5:09:02 AM.  Sleep onset time was 1.7 minutes and the sleep efficiency was 63.9%%. The total sleep time was 228.8 minutes.  Stage REM latency was 165.5 minutes.  The patient spent 5.0%% of the night in stage N1 sleep, 82.3%% in stage N2 sleep, 0.0%% in stage N3 and 12.7% in REM.  Alpha intrusion was absent.  Supine sleep was 74.89%.  RESPIRATORY PARAMETERS The overall apnea/hypopnea index (AHI) was 62.7 per hour. There were 160 total apneas, including 144 obstructive, 11 central and 5 mixed apneas. There were 79 hypopneas and 0 RERAs.  The AHI during Stage REM sleep was 66.2 per hour.  AHI while supine was 72.8 per hour.  The mean oxygen saturation was 84.3%. The minimum SpO2 during sleep was 55.0%.  moderate snoring was noted during this study.  CARDIAC DATA The  2 lead EKG demonstrated sinus rhythm. The mean heart rate was 76.9 beats per minute. Other EKG findings include: Atrial Fibrillation.  LEG MOVEMENT DATA The total PLMS were 0 with a resulting PLMS index of 0.0. Associated arousal with leg movement index was 0.0 .  IMPRESSIONS - Severe obstructive sleep apnea occurred during this study (AHI = 62.7/h). - No significant central sleep apnea occurred during this study (CAI = 2.9/h). - Severe oxygen desaturation was noted during this study (Min O2 = 55.0%). - The patient snored with moderate snoring volume. - EKG findings include Atrial Fibrillation. - Clinically significant periodic limb movements did not occur during sleep. No significant associated arousals. - She could not tolerate CPAP therapy inspite of changing several masks & low pressures   DIAGNOSIS - Obstructive Sleep Apnea (G47.33)   RECOMMENDATIONS - Therapeutic CPAP titration to determine optimal pressure required to alleviate sleep disordered breathing. Unfortunately, she could not tolerate CPAP during this study. May benefit from mask desensitization. Due to high BMI, may not be a candidate for hypoglossal nerve stimulator - Avoid alcohol, sedatives and other CNS depressants that may worsen sleep apnea and disrupt normal sleep architecture. - Sleep hygiene should be reviewed to assess factors that may improve sleep quality. - Weight management and regular exercise should be initiated or continued if appropriate.   Kara Mead MD Board Certified in [place]

## 2022-01-19 NOTE — Telephone Encounter (Signed)
Brittany Milch, MD  Phillips Grout, RN; Katrinka Blazing R, CMA 3 days ago   Severe OSA was confirmed , AHI was 62/h -that means she stopped breathing every minute!  Unfortunately she did not tolerate CPAP in spite of changing several masks and low pressures during the study.  This is so severe that dental appliance would not be helpful.  Unfortunately her BMI is high and she would not be a candidate for inspire therapy but can still refer to ENT for evaluation  I would suggest that she undergo mask desensitization in our sleep lab in the daytime and see if she can tolerate CPAP again  If she has any more questions, please schedule follow-up visit with me/APP to discuss     Called and spoke with pt letting her know the results of HST and recs per Dr. Vassie Loll. Pt has been scheduled a video visit with KC to have all further discussed. Nothing further needed.

## 2022-01-22 ENCOUNTER — Telehealth (INDEPENDENT_AMBULATORY_CARE_PROVIDER_SITE_OTHER): Payer: BC Managed Care – PPO | Admitting: Nurse Practitioner

## 2022-01-22 ENCOUNTER — Encounter: Payer: Self-pay | Admitting: Nurse Practitioner

## 2022-01-22 DIAGNOSIS — G4733 Obstructive sleep apnea (adult) (pediatric): Secondary | ICD-10-CM

## 2022-01-22 DIAGNOSIS — Z6839 Body mass index (BMI) 39.0-39.9, adult: Secondary | ICD-10-CM | POA: Diagnosis not present

## 2022-01-22 DIAGNOSIS — Z789 Other specified health status: Secondary | ICD-10-CM

## 2022-01-22 NOTE — Progress Notes (Signed)
Patient ID: Brittany Todd, female     DOB: Sep 09, 1960, 61 y.o.      MRN: [id number]  Chief Complaint  Patient presents with   Follow-up    Completed HST and wants to discuss. Had a CPAP machine but returned in machine a few weeks.     Virtual Visit via Video Note  I connected with [name] on 01/22/22 at 12:00 PM EDT by a video enabled telemedicine application and verified that I am speaking with the correct person using two identifiers.  Location: Patient: Home Provider: Office   I discussed the limitations of evaluation and management by telemedicine and the availability of in person appointments. The patient expressed understanding and agreed to proceed.  History of Present Illness: 61 year old female, never smoker followed for severe OSA intolerant of CPAP therapy. She is a patient of Dr. Reginia Naas and last seen in office on 12/23/2021. Past medical history significant for migraines, GERD, hypothyroid, TMJ, CKD, obesity, anxiety, chronic knee pain.  12/23/2021: OV with Dr. Vassie Loll.  Presented with snoring and witnessed apneas by her husband in October 2022 and HST was ordered.  This was completed in February 2023 and showed severe OSA with AHI 59.  She was recommended to start on auto CPAP 5-15 and scheduled for a CPAP titration study.  She then followed up at this office visit reporting significant issues with tolerance.  Felt like the full facemask had suffocated her.  She changed to nasal pillows but cannot adjust to this.  Wanted to discuss alternatives; however was concerned that her BMI would be too high for inspire device placement and oral appliance would not be appropriate given her severity.  Working on weight loss with PCP.  Changed CPAP titration to split-night study to provide opportunity for CPAP titration and see if they could desensitize her.  01/22/2022: Today-follow-up Patient presents today via virtual visit for follow-up to discuss split-night study results, which  showed severe obstructive sleep apnea with AHI 62.  She was unable to tolerate CPAP during the study despite several masks and low pressures.  Today, she reports that she has tried numerous masks, even outside of the sleep study.  Feels like every time she wears 1 she ends up feeling like she is suffocating and has to remove it halfway through the night.  Her previous download from 11/30/2021-12/29/2021 showed 2 days of use with residual AHI of 29.2. Continues to have snoring and witnessed apneas. She is actively trying to lose weight; has lost 10 pounds since her OV in April. Tried Ozempic but was nauseated on it so she is now being referred to weight management. It is hard for her to exercise given her knee pain so she is hopeful that her upcoming knee replacement will help with this. She was previously cleared by Dr. Vassie Loll for her surgery and deemed high risk given her untreated severe OSA. She denies morning headaches, drowsy driving or narcolepsy.   Allergies  Allergen Reactions   Codeine Other (See Comments)    hallucinations   Cymbalta [Duloxetine Hcl] Other (See Comments)    lethargic   Morphine And Related Other (See Comments)    Hallucinations    Immunization History  Administered Date(s) Administered   Influenza,inj,Quad PF,6+ Mos 07/24/2015, 07/15/2018, 09/05/2021   Influenza-Unspecified 07/24/2015, 09/11/2016, 09/30/2017, 07/15/2018, 06/14/2020, 07/01/2020   Moderna Sars-Covid-2 Vaccination 12/22/2019, 01/22/2020   Tdap 02/22/2018, 02/26/2021   Zoster Recombinat (Shingrix) 02/26/2021   Past Medical History:  Diagnosis Date  ADD (attention deficit disorder)    Anxiety    Chest tightness    DOE (dyspnea on exertion)    GERD (gastroesophageal reflux disease)    Hashimoto's disease    Hyperlipemia    Hypothyroid    Migraines    Renal stone    TMJ (dislocation of temporomandibular joint)    Vitamin D deficiency     Tobacco History: Social History   Tobacco Use  Smoking  Status Never  Smokeless Tobacco Never   Counseling given: Not Answered   Outpatient Medications Prior to Visit  Medication Sig Dispense Refill   acyclovir (ZOVIRAX) 400 MG tablet Take 400 mg by mouth 3 (three) times daily as needed.     amphetamine-dextroamphetamine (ADDERALL) 10 MG tablet Take 10 mg by mouth daily with breakfast.     aspirin EC 81 MG tablet Take by mouth.     b complex vitamins tablet Take 1 tablet by mouth daily.     baclofen (LIORESAL) 10 MG tablet Take 10 mg by mouth as needed.     Cholecalciferol (VITAMIN D3 PO) Take 1,000 Int'l Units by mouth daily.     levothyroxine (SYNTHROID, LEVOTHROID) 88 MCG tablet Take 88 mcg by mouth daily before breakfast.     losartan (COZAAR) 50 MG tablet Take 50 mg by mouth daily.     meclizine (ANTIVERT) 25 MG tablet meclizine 25 mg tablet  TAKE 1/2 TO 1 (ONE-HALF TO ONE) TABLET BY MOUTH THREE TIMES DAILY     Multiple Minerals-Vitamins (CAL MAG ZINC +D3) TABS Take 1 tablet by mouth daily.     Omega-3 Fatty Acids (FISH OIL OMEGA-3 PO) Take 2 capsules by mouth. Fish Oil 1200 mg per soft gel Omega 3, 600 mg per soft gel     omeprazole (PRILOSEC OTC) 20 MG tablet Take 20 mg by mouth as needed.     rosuvastatin (CRESTOR) 40 MG tablet rosuvastatin 40 mg tablet  TAKE 1 TABLET BY MOUTH ONCE DAILY     SUMAtriptan (IMITREX) 100 MG tablet Take 1 tablet (100 mg total) by mouth once as needed for up to 1 dose. May repeat in 2 hours if headache persists or recurs. 10 tablet 12   escitalopram (LEXAPRO) 20 MG tablet Take 10 mg by mouth daily.     pravastatin (PRAVACHOL) 40 MG tablet Take 40 mg by mouth daily. (Patient not taking: Reported on 12/23/2021)     No facility-administered medications prior to visit.     Review of Systems:   Constitutional: No weight loss or gain, night sweats, fevers, chills, fatigue, or lassitude. HEENT: No headaches, difficulty swallowing, tooth/dental problems, or sore throat. No sneezing, itching, ear ache, nasal  congestion, or post nasal drip. +snoring CV:  No chest pain, orthopnea, PND, swelling in lower extremities, anasarca, dizziness, palpitations, syncope Resp: +witnessed nocturnal apneas. No shortness of breath with exertion or at rest. No excess mucus or change in color of mucus. No productive or non-productive. No hemoptysis. No wheezing.  No chest wall deformity Skin: No rash, lesions, ulcerations MSK:  No joint pain or swelling.  No decreased range of motion.  No back pain. Neuro: No dizziness or lightheadedness.  Psych: No depression or anxiety. Mood stable.   Observations/Objective: Patient is well-developed, well-nourished in no acute distress; A&Ox3. Resting comfortably at home. Unlabored, regular breathing. Speech is clear and coherent with logical content.   01/08/2022 split night study: AHI 62.7/h with SpO2 low 55% and average 84.3%   Assessment and Plan: OSA (obstructive  sleep apnea) Severe OSA; intolerant to CPAP despite trialing multiple masks. She does not want referral for mask fitting as she has already done this with her medical supply company. We did discuss that given her BMI, she may not be a candidate for Inspire but we will send referral to ENT as she is actively trying to lose weight and may be considered. AHI is less than 65 so would qualify from that standpoint. I am concerned she may need BiPAP therapy, given the feelings of her being suffocated. Will discuss further with Dr. Vassie Loll.    Obesity Actively working on weight loss. Unable to tolerate Ozempic. Currently working with medical weight management.     I discussed the assessment and treatment plan with the patient. The patient was provided an opportunity to ask questions and all were answered. The patient agreed with the plan and demonstrated an understanding of the instructions.   The patient was advised to call back or seek an in-person evaluation if the symptoms worsen or if the condition fails to improve as  anticipated.  I provided 25 minutes of non-face-to-face time during this encounter.   Noemi Chapel, NP

## 2022-01-22 NOTE — Assessment & Plan Note (Signed)
Severe OSA; intolerant to CPAP despite trialing multiple masks. She does not want referral for mask fitting as she has already done this with her medical supply company. We did discuss that given her BMI, she may not be a candidate for Inspire but we will send referral to ENT as she is actively trying to lose weight and may be considered. AHI is less than 65 so would qualify from that standpoint. I am concerned she may need BiPAP therapy, given the feelings of her being suffocated. Will discuss further with Dr. Vassie Loll.

## 2022-01-22 NOTE — Assessment & Plan Note (Signed)
Actively working on weight loss. Unable to tolerate Ozempic. Currently working with medical weight management.

## 2022-01-23 ENCOUNTER — Telehealth: Payer: Self-pay | Admitting: Nurse Practitioner

## 2022-01-23 DIAGNOSIS — Z789 Other specified health status: Secondary | ICD-10-CM

## 2022-01-23 DIAGNOSIS — G4733 Obstructive sleep apnea (adult) (pediatric): Secondary | ICD-10-CM

## 2022-01-23 NOTE — Telephone Encounter (Signed)
Called and spoke with patient. She stated that she thought about the Inspire device vs Bipap machine and she would to go proceed with the bipap machine. She would like for the order to be sent to [facility].   Joellen Jersey, can you please advise? Thanks!

## 2022-01-27 ENCOUNTER — Telehealth: Payer: Self-pay | Admitting: Nurse Practitioner

## 2022-01-27 NOTE — Telephone Encounter (Signed)
Pt decided to reach out to the sleep lab about the bipap titration.

## 2022-01-27 NOTE — Telephone Encounter (Signed)
Cobb, Ruby Cola, NP to Maurene Capes, CMA      6:12 PM She will have to undergo in lab study again to qualify for BiPAP. I will place the order today. It will be listed as a CPAP titration but requested to transition to BiPAP if/when she doesn't tolerate CPAP. Thanks!   Spoke with the pt an notified of response per KC. She was agreeable to CPAP titration and this was already ordered.

## 2022-01-28 NOTE — Telephone Encounter (Signed)
Thank you :)

## 2022-02-03 ENCOUNTER — Ambulatory Visit (HOSPITAL_BASED_OUTPATIENT_CLINIC_OR_DEPARTMENT_OTHER): Payer: BC Managed Care – PPO | Attending: Nurse Practitioner | Admitting: Pulmonary Disease

## 2022-02-03 ENCOUNTER — Encounter (INDEPENDENT_AMBULATORY_CARE_PROVIDER_SITE_OTHER): Payer: Self-pay

## 2022-02-03 DIAGNOSIS — G4733 Obstructive sleep apnea (adult) (pediatric): Secondary | ICD-10-CM

## 2022-02-03 DIAGNOSIS — Z789 Other specified health status: Secondary | ICD-10-CM

## 2022-02-04 ENCOUNTER — Encounter (HOSPITAL_BASED_OUTPATIENT_CLINIC_OR_DEPARTMENT_OTHER): Payer: BC Managed Care – PPO | Admitting: Pulmonary Disease

## 2022-02-04 NOTE — Patient Instructions (Addendum)
DUE TO COVID-19 ONLY TWO VISITORS  (aged 63 and older)  ARE ALLOWED TO COME WITH YOU AND STAY IN THE WAITING ROOM ONLY DURING PRE OP AND PROCEDURE.   **NO VISITORS ARE ALLOWED IN THE SHORT STAY AREA OR RECOVERY ROOM!!**  IF YOU WILL BE ADMITTED INTO THE HOSPITAL YOU ARE ALLOWED ONLY FOUR SUPPORT PEOPLE DURING VISITATION HOURS ONLY (7 AM -8PM)   The support person(s) must pass our screening, gel in and out, and wear a mask at all times, including in the patient's room. Patients must also wear a mask when staff or their support person are in the room. Visitors GUEST BADGE MUST BE WORN VISIBLY  One adult visitor may remain with you overnight and MUST be in the room by 8 P.M.     Your procedure is scheduled on: 02/17/22   Report to [facility] Main Entrance    Report to admitting at 1:30 PM   Call this number if you have problems the morning of surgery [phone]   Do not eat food :After Midnight.   After Midnight you may have the following liquids until 1:15 PM DAY OF SURGERY  Water Black Coffee (sugar ok, NO MILK/CREAM OR CREAMERS)  Tea (sugar ok, NO MILK/CREAM OR CREAMERS) regular and decaf                             Plain Jell-O (NO RED)                                           Fruit ices (not with fruit pulp, NO RED)                                     Popsicles (NO RED)                                                                  Juice: apple, WHITE grape, WHITE cranberry Sports drinks like Gatorade (NO RED) Clear broth(vegetable,chicken,beef)    The day of surgery:  Drink ONE (1) Pre-Surgery Clear Ensure at 1:15 PM the morning of surgery. Drink in one sitting. Do not sip.  This drink was given to you during your hospital  pre-op appointment visit. Nothing else to drink after completing the  Pre-Surgery Clear Ensure.          If you have questions, please contact your surgeon's office.   FOLLOW BOWEL PREP AND ANY ADDITIONAL PRE OP INSTRUCTIONS YOU RECEIVED  FROM YOUR SURGEON'S OFFICE!!!     Oral Hygiene is also important to reduce your risk of infection.                                    Remember - BRUSH YOUR TEETH THE MORNING OF SURGERY WITH YOUR REGULAR TOOTHPASTE   Take these medicines the morning of surgery with A SIP OF WATER: Levothyroxine, Meclizine, Omeprazole, Zofran, Rosuvastatin, Zonisamide   Bring CPAP mask and tubing day  of surgery.                              You may not have any metal on your body including hair pins, jewelry, and body piercing             Do not wear make-up, lotions, powders, perfumes, or deodorant  Do not wear nail polish including gel and S&S, artificial/acrylic nails, or any other type of covering on natural nails including finger and toenails. If you have artificial nails, gel coating, etc. that needs to be removed by a nail salon please have this removed prior to surgery or surgery may need to be canceled/ delayed if the surgeon/ anesthesia feels like they are unable to be safely monitored.   Do not shave  48 hours prior to surgery.    Do not bring valuables to the hospital. [facility] IS NOT             RESPONSIBLE   FOR VALUABLES.   Bring small overnight bag day of surgery.   DO NOT BRING YOUR HOME MEDICATIONS TO THE HOSPITAL. PHARMACY WILL DISPENSE MEDICATIONS LISTED ON YOUR MEDICATION LIST TO YOU DURING YOUR ADMISSION IN THE HOSPITAL!    Special Instructions: Bring a copy of your healthcare power of attorney and living will documents         the day of surgery if you haven't scanned them before.              Please read over the following fact sheets you were given: IF YOU HAVE QUESTIONS ABOUT YOUR PRE-OP INSTRUCTIONS PLEASE CALL [phone]- [facility]     [facility] - Preparing for Surgery Before surgery, you can play an important role.  Because skin is not sterile, your skin needs to be as free of germs as possible.  You can reduce the number of germs on your skin by washing with CHG  (chlorahexidine gluconate) soap before surgery.  CHG is an antiseptic cleaner which kills germs and bonds with the skin to continue killing germs even after washing. Please DO NOT use if you have an allergy to CHG or antibacterial soaps.  If your skin becomes reddened/irritated stop using the CHG and inform your nurse when you arrive at Short Stay. Do not shave (including legs and underarms) for at least 48 hours prior to the first CHG shower.  You may shave your face/neck.  Please follow these instructions carefully:  1.  Shower with CHG Soap the night before surgery and the  morning of surgery.  2.  If you choose to wash your hair, wash your hair first as usual with your normal  shampoo.  3.  After you shampoo, rinse your hair and body thoroughly to remove the shampoo.                             4.  Use CHG as you would any other liquid soap.  You can apply chg directly to the skin and wash.  Gently with a scrungie or clean washcloth.  5.  Apply the CHG Soap to your body ONLY FROM THE NECK DOWN.   Do   not use on face/ open                           Wound or open sores. Avoid contact with eyes,  ears mouth and   genitals (private parts).                       Wash face,  Genitals (private parts) with your normal soap.             6.  Wash thoroughly, paying special attention to the area where your    surgery  will be performed.  7.  Thoroughly rinse your body with warm water from the neck down.  8.  DO NOT shower/wash with your normal soap after using and rinsing off the CHG Soap.                9.  Pat yourself dry with a clean towel.            10.  Wear clean pajamas.            11.  Place clean sheets on your bed the night of your first shower and do not  sleep with pets. Day of Surgery : Do not apply any lotions/deodorants the morning of surgery.  Please wear clean clothes to the hospital/surgery center.  FAILURE TO FOLLOW THESE INSTRUCTIONS MAY RESULT IN THE CANCELLATION OF YOUR  SURGERY  PATIENT SIGNATURE_________________________________  NURSE SIGNATURE__________________________________  ________________________________________________________________________   Rogelia Mire  An incentive spirometer is a tool that can help keep your lungs clear and active. This tool measures how well you are filling your lungs with each breath. Taking long deep breaths may help reverse or decrease the chance of developing breathing (pulmonary) problems (especially infection) following: A long period of time when you are unable to move or be active. BEFORE THE PROCEDURE  If the spirometer includes an indicator to show your best effort, your nurse or respiratory therapist will set it to a desired goal. If possible, sit up straight or lean slightly forward. Try not to slouch. Hold the incentive spirometer in an upright position. INSTRUCTIONS FOR USE  Sit on the edge of your bed if possible, or sit up as far as you can in bed or on a chair. Hold the incentive spirometer in an upright position. Breathe out normally. Place the mouthpiece in your mouth and seal your lips tightly around it. Breathe in slowly and as deeply as possible, raising the piston or the ball toward the top of the column. Hold your breath for 3-5 seconds or for as long as possible. Allow the piston or ball to fall to the bottom of the column. Remove the mouthpiece from your mouth and breathe out normally. Rest for a few seconds and repeat Steps 1 through 7 at least 10 times every 1-2 hours when you are awake. Take your time and take a few normal breaths between deep breaths. The spirometer may include an indicator to show your best effort. Use the indicator as a goal to work toward during each repetition. After each set of 10 deep breaths, practice coughing to be sure your lungs are clear. If you have an incision (the cut made at the time of surgery), support your incision when coughing by placing a pillow or  rolled up towels firmly against it. Once you are able to get out of bed, walk around indoors and cough well. You may stop using the incentive spirometer when instructed by your caregiver.  RISKS AND COMPLICATIONS Take your time so you do not get dizzy or light-headed. If you are in pain, you may need to take or ask for pain  medication before doing incentive spirometry. It is harder to take a deep breath if you are having pain. AFTER USE Rest and breathe slowly and easily. It can be helpful to keep track of a log of your progress. Your caregiver can provide you with a simple table to help with this. If you are using the spirometer at home, follow these instructions: SEEK MEDICAL CARE IF:  You are having difficultly using the spirometer. You have trouble using the spirometer as often as instructed. Your pain medication is not giving enough relief while using the spirometer. You develop fever of 100.5 F (38.1 C) or higher. SEEK IMMEDIATE MEDICAL CARE IF:  You cough up bloody sputum that had not been present before. You develop fever of 102 F (38.9 C) or greater. You develop worsening pain at or near the incision site. MAKE SURE YOU:  Understand these instructions. Will watch your condition. Will get help right away if you are not doing well or get worse. Document Released: 12/28/2006 Document Revised: 11/09/2011 Document Reviewed: 02/28/2007 Exit[employer] Patient Information 2014 ExitCare, [place].   ________________________________________________________________________  WHAT IS A BLOOD TRANSFUSION? Blood Transfusion Information  A transfusion is the replacement of blood or some of its parts. Blood is made up of multiple cells which provide different functions. Red blood cells carry oxygen and are used for blood loss replacement. White blood cells fight against infection. Platelets control bleeding. Plasma helps clot blood. Other blood products are available for specialized needs, such  as hemophilia or other clotting disorders. BEFORE THE TRANSFUSION  Who gives blood for transfusions?  Healthy volunteers who are fully evaluated to make sure their blood is safe. This is blood bank blood. Transfusion therapy is the safest it has ever been in the practice of medicine. Before blood is taken from a donor, a complete history is taken to make sure that person has no history of diseases nor engages in risky social behavior (examples are intravenous drug use or sexual activity with multiple partners). The donor's travel history is screened to minimize risk of transmitting infections, such as malaria. The donated blood is tested for signs of infectious diseases, such as HIV and hepatitis. The blood is then tested to be sure it is compatible with you in order to minimize the chance of a transfusion reaction. If you or a relative donates blood, this is often done in anticipation of surgery and is not appropriate for emergency situations. It takes many days to process the donated blood. RISKS AND COMPLICATIONS Although transfusion therapy is very safe and saves many lives, the main dangers of transfusion include:  Getting an infectious disease. Developing a transfusion reaction. This is an allergic reaction to something in the blood you were given. Every precaution is taken to prevent this. The decision to have a blood transfusion has been considered carefully by your caregiver before blood is given. Blood is not given unless the benefits outweigh the risks. AFTER THE TRANSFUSION Right after receiving a blood transfusion, you will usually feel much better and more energetic. This is especially true if your red blood cells have gotten low (anemic). The transfusion raises the level of the red blood cells which carry oxygen, and this usually causes an energy increase. The nurse administering the transfusion will monitor you carefully for complications. HOME CARE INSTRUCTIONS  No special instructions  are needed after a transfusion. You may find your energy is better. Speak with your caregiver about any limitations on activity for underlying diseases you may have. SEEK  MEDICAL CARE IF:  Your condition is not improving after your transfusion. You develop redness or irritation at the intravenous (IV) site. SEEK IMMEDIATE MEDICAL CARE IF:  Any of the following symptoms occur over the next 12 hours: Shaking chills. You have a temperature by mouth above 102 F (38.9 C), not controlled by medicine. Chest, back, or muscle pain. People around you feel you are not acting correctly or are confused. Shortness of breath or difficulty breathing. Dizziness and fainting. You get a rash or develop hives. You have a decrease in urine output. Your urine turns a dark color or changes to pink, red, or brown. Any of the following symptoms occur over the next 10 days: You have a temperature by mouth above 102 F (38.9 C), not controlled by medicine. Shortness of breath. Weakness after normal activity. The white part of the eye turns yellow (jaundice). You have a decrease in the amount of urine or are urinating less often. Your urine turns a dark color or changes to pink, red, or brown. Document Released: 08/14/2000 Document Revised: 11/09/2011 Document Reviewed: 04/02/2008 [facility] Patient Information 2014 [place], [place].  _______________________________________________________________________

## 2022-02-04 NOTE — Progress Notes (Addendum)
COVID Vaccine Completed: yes x2  Date of COVID positive in last 90 days: no  PCP - Mady Gemma, PA Cardiologist - n/a  Medical clearance by Mady Gemma 01/08/22 on chart Pulmonary clearance??  Chest x-ray - n/a EKG - 02/05/22 Epic/chart Stress Test - 05/02/19 Epic ECHO - n/a Cardiac Cath - n/a Pacemaker/ICD device last checked: n/a Spinal Cord Stimulator: n/a  Bowel Prep - no  Sleep Study - waiting for a machine, recently diagnosed CPAP -   Fasting Blood Sugar - n/a Checks Blood Sugar _____ times a day  Blood Thinner Instructions: Aspirin Instructions: ASA 81, hold 7 days before Last Dose:  Activity level: Can go up a flight of stairs and perform activities of daily living without stopping and without symptoms of chest pain or shortness of breath. Difficulty with stairs due to knees    Anesthesia review: factor V, HTN, DOE, OSA  Patient denies shortness of breath, fever, cough and chest pain at PAT appointment  Patient verbalized understanding of instructions that were given to them at the PAT appointment. Patient was also instructed that they will need to review over the PAT instructions again at home before surgery.

## 2022-02-05 ENCOUNTER — Telehealth: Payer: Self-pay | Admitting: Nurse Practitioner

## 2022-02-05 ENCOUNTER — Encounter (HOSPITAL_COMMUNITY): Payer: Self-pay

## 2022-02-05 ENCOUNTER — Encounter (HOSPITAL_COMMUNITY)
Admission: RE | Admit: 2022-02-05 | Discharge: 2022-02-05 | Disposition: A | Discharge status: Home / Self Care | Payer: BC Managed Care – PPO | Source: Ambulatory Visit | Attending: Orthopedic Surgery | Admitting: Orthopedic Surgery

## 2022-02-05 VITALS — BP 137/92 | HR 85 | Temp 98.6°F | Resp 14

## 2022-02-05 DIAGNOSIS — M1711 Unilateral primary osteoarthritis, right knee: Secondary | ICD-10-CM

## 2022-02-05 DIAGNOSIS — I2510 Atherosclerotic heart disease of native coronary artery without angina pectoris: Secondary | ICD-10-CM

## 2022-02-05 DIAGNOSIS — Z01818 Encounter for other preprocedural examination: Secondary | ICD-10-CM

## 2022-02-05 HISTORY — DX: Other specified postprocedural states: Z98.890

## 2022-02-05 HISTORY — DX: Activated protein C resistance: D68.51

## 2022-02-05 HISTORY — DX: Unspecified osteoarthritis, unspecified site: M19.90

## 2022-02-05 HISTORY — DX: Essential (primary) hypertension: I10

## 2022-02-05 HISTORY — DX: Other specified postprocedural states: R11.2

## 2022-02-05 LAB — TYPE AND SCREEN
ABO/RH(D): O POS
Antibody Screen: NEGATIVE

## 2022-02-05 LAB — SURGICAL PCR SCREEN
MRSA, PCR: NEGATIVE
Staphylococcus aureus: POSITIVE — AB

## 2022-02-05 NOTE — Telephone Encounter (Signed)
Called patient and she states that she had a sleep study test done and would like to go over the results and the options if she needs any.  Katie please advise

## 2022-02-05 NOTE — Progress Notes (Signed)
STAPH + results sent to Dr. Charlann Boxer via Epic

## 2022-02-06 ENCOUNTER — Other Ambulatory Visit: Payer: Self-pay | Admitting: Student

## 2022-02-06 DIAGNOSIS — M5136 Other intervertebral disc degeneration, lumbar region: Secondary | ICD-10-CM

## 2022-02-06 NOTE — Telephone Encounter (Signed)
Called patient back to let her know that when we get the results back from her sleep study that Brittany Todd will call her to let her know what the results are. And talk to her about the masks. Patient voiced frustration about this but stated ok. Nothing further needed

## 2022-02-06 NOTE — Telephone Encounter (Signed)
Her sleep study was just completed on 6/7 and we do not have final results yet. It will need to be read/reviewed by one of the sleep doctors and then we will get her set up for an appointment to review results/treatment options. Thanks.

## 2022-02-06 NOTE — Telephone Encounter (Signed)
Called patient and stated that she wants to speak to [facility] personally. She states that she wants to talk to [facility] about how the sleep study went and the masks that she does not want. Patient was advised that Joellen Jersey is busy in clinic today so I was unsure if she could call.

## 2022-02-06 NOTE — Telephone Encounter (Signed)
Please let her know that I will not order any new treatment/mask or make changes to her care without talking to her first so as soon as I get her study results, we can discuss how the test went, what masks she liked/didn't like, etc. I will personally call her with these. Thanks.

## 2022-02-09 ENCOUNTER — Telehealth: Payer: Self-pay | Admitting: Nurse Practitioner

## 2022-02-10 NOTE — Telephone Encounter (Signed)
Patient checking on message sent yesterday. Patient having surgery 02/17/2022. Would like to know her options. Patient phone number is [phone].

## 2022-02-10 NOTE — Telephone Encounter (Signed)
Patient checking on message sent yesterday. Patient phone number is [phone].

## 2022-02-10 NOTE — Telephone Encounter (Signed)
Called and spoke with pt letting her know that I was going to send the message to [facility] and once she responded we would let her know what she said and pt verbalized understanding.   Routing to [employer].

## 2022-02-11 NOTE — Telephone Encounter (Signed)
I have sent Dr. Vassie Loll a message to follow up on this. I do not have the final report from the study back yet. I will call her as soon as I have heard from her or gotten results and discuss next steps with her. Please let her know we are sorry for the wait and we normally tell people to allow for two weeks after their study for final results/recommendations. Thanks.

## 2022-02-12 DIAGNOSIS — G4733 Obstructive sleep apnea (adult) (pediatric): Secondary | ICD-10-CM | POA: Diagnosis not present

## 2022-02-12 DIAGNOSIS — Z789 Other specified health status: Secondary | ICD-10-CM

## 2022-02-12 NOTE — Progress Notes (Signed)
Please see above and notify patient. She called in and was routed to triage this afternoon. Thanks!

## 2022-02-12 NOTE — Telephone Encounter (Signed)
Still waiting on recommendations from RA. Nothing new to report to patient.

## 2022-02-12 NOTE — Procedures (Signed)
Patient Name: Brittany Todd, Cutbirth Date: 02/03/2024 Gender: Female D.O.B: 1960/10/29 Age (years): 69 Referring Provider: Cyril Mourning MD, ABSM Height (inches): 59 Interpreting Physician: Cyril Mourning MD, ABSM Weight (lbs): 185 RPSGT: Ulyess Mort BMI: 37 MRN: [id number] Neck Size: 15.00 <br> <br>  CLINICAL INFORMATION The patient is referred for a BiPAP titration to treat sleep apnea.    Date of NPSG: 12/2021 showed severe OSA with AHI 62/h & lowest desat of 55 %  SLEEP STUDY TECHNIQUE As per the AASM Manual for the Scoring of Sleep and Associated Events v2.3 (April 2016) with a hypopnea requiring 4% desaturations.  The channels recorded and monitored were frontal, central and occipital EEG, electrooculogram (EOG), submentalis EMG (chin), nasal and oral airflow, thoracic and abdominal wall motion, anterior tibialis EMG, snore microphone, electrocardiogram, and pulse oximetry. Bilevel positive airway pressure (BPAP) was initiated at the beginning of the study and titrated to treat sleep-disordered breathing.  MEDICATIONS Medications self-administered by patient taken the night of the study : ZONISAMIDE, Rosuvastatin  RESPIRATORY PARAMETERS Optimal IPAP Pressure (cm): 21 AHI at Optimal Pressure (/hr) 55.9 Optimal EPAP Pressure (cm): 17   Overall Minimal O2 (%): 63.0 Minimal O2 at Optimal Pressure (%): 87.0 SLEEP ARCHITECTURE Start Time: 10:38:35 PM Stop Time: 4:56:15 AM Total Time (min): 377.7 Total Sleep Time (min): 113 Sleep Latency (min): 8.3 Sleep Efficiency (%): 29.9% REM Latency (min): 9.0 WASO (min): 256.3 Stage N1 (%): 32.3% Stage N2 (%): 52.7% Stage N3 (%): 1.3% Stage R (%): 13.7 Supine (%): 97.35 Arousal Index (/hr): 35.0     CARDIAC DATA The 2 lead EKG demonstrated sinus rhythm. The mean heart rate was 69.9 beats per minute. Other EKG findings include: PVCs.   LEG MOVEMENT DATA The total Periodic Limb Movements of Sleep (PLMS) were 0. The PLMS index was 0.0. A  PLMS index of <15 is considered normal in adults.  IMPRESSIONS - An optimal PAP pressure was selected for this patient ( 21 / 17cm of water) . She did not tolerate CPAP due to high pressures - Moderate Central Sleep Apnea was noted during this titration (CAI = 27.1/h). - Severe oxygen desaturations were observed during this titration (min O2 = 63.0%). - No snoring was audible during this study. - 2-lead EKG demonstrated: PVCs - Clinically significant periodic limb movements were not noted during this study. Arousals associated with PLMs were rare. - Total sleep time was only 115 mins but 2-3 periods of REM sleep was noted. - Multiple masks were tied during this titration study   DIAGNOSIS - Obstructive Sleep Apnea (G47.33)   RECOMMENDATIONS - Trial of BiPAP therapy on 21/17 cm H2O with a Small size Resmed Full Face AirFit F20 mask and heated humidification. Alternatively autoBiPAP can be tried - Avoid alcohol, sedatives and other CNS depressants that may worsen sleep apnea and disrupt normal sleep architecture. - Sleep hygiene should be reviewed to assess factors that may improve sleep quality. - Weight management and regular exercise should be initiated or continued. - Return to Sleep Center for re-evaluation after 4 weeks of therapy    Cyril Mourning MD Board Certified in Sleep medicine

## 2022-02-12 NOTE — Telephone Encounter (Signed)
Please see result note from today on CPAP titration. I have forwarded this to you as well. Thanks!

## 2022-02-12 NOTE — Telephone Encounter (Signed)
Patient is calling about recommendations. States already knows the results. She has called a lot of times. Patient phone number is [phone].

## 2022-02-13 ENCOUNTER — Telehealth: Payer: Self-pay | Admitting: *Deleted

## 2022-02-13 NOTE — Telephone Encounter (Signed)
Per Micheline Maze NP: Please see result note from today on CPAP titration. I have forwarded this to you as well. Thanks!   Please let pt know - she only slept for 2 h but events were controlled with bipap  send Rx for autoBiPAP - EPAP 8 , PS + 4, IPAP max 21 Small airfit F 20 mask   OV in 6 weeks after starting  Called and spoke with patient, attempted to provided results/recommendations per Dr. Vassie Loll, however, patient states she already knew she had sleep apnea, cannot tolerate CPAP and during the titration study could not titrate the Bipap (states she felt like she was being tortured, and they would not let her take the mask off).  She said she had been waiting 2 weeks to hear from our office regarding this.  I advised her that we just received the results/recommendations per Dr. Vassie Loll and that the results are not readily available right after the study is complete that a provider has to review the study and give results/recommendations.  I let her know I would provide Katie with the information and she would call her to discuss this with her.  She asked if it would be 2 weeks before she received a call.  I advised her that it would not be 2 weeks, that I called to give her the results of the study and to get clarification of what questions she had to see if Florentina Addison could call her to address the questions or if we needed to set up an OV/video visit to address multiple questions.  She asked if she would receive a call today because she is having surgery on Tuesday and she was hoping to have this set up before then.   Advised her that Florentina Addison would call her to discuss next steps.

## 2022-02-13 NOTE — Telephone Encounter (Signed)
Spoke with patient regarding sleep study results. She has very severe OSA and was well-controlled on BiPAP. Pt reported that she was unable to tolerate the BiPAP the entire study and felt like she was being "forced" to wear it all night long by the tech. She felt like she could "barely sleep or breathe" with the mask on. She asked if oral appliance would be an option, which I again, educated that given the severity of her OSA, this would not be appropriate. She would like to move forward with possible Inspire device. Referral was previously placed to ENT. She was provided with the contact information to set up an appointment with Dr. Jenne Pane.  We also discussed that she has knee surgery Tuesday and that given her severe, untreated OSA, she will be a high risk. Previous surgical clearance was provided by Dr. Vassie Loll and faxed in May, per my review of her chart. Pt verbalized understanding. Nothing further needed.

## 2022-02-17 ENCOUNTER — Ambulatory Visit (HOSPITAL_COMMUNITY): Payer: BC Managed Care – PPO | Admitting: Physician Assistant

## 2022-02-17 ENCOUNTER — Ambulatory Visit (HOSPITAL_COMMUNITY): Payer: BC Managed Care – PPO | Admitting: Anesthesiology

## 2022-02-17 ENCOUNTER — Observation Stay (HOSPITAL_COMMUNITY)
Admission: RE | Admit: 2022-02-17 | Discharge: 2022-02-19 | Disposition: A | Discharge status: Home / Self Care | Payer: BC Managed Care – PPO | Source: Ambulatory Visit | Attending: Orthopedic Surgery | Admitting: Orthopedic Surgery

## 2022-02-17 ENCOUNTER — Encounter (HOSPITAL_COMMUNITY): Payer: Self-pay | Admitting: Orthopedic Surgery

## 2022-02-17 ENCOUNTER — Other Ambulatory Visit: Payer: Self-pay

## 2022-02-17 ENCOUNTER — Encounter (HOSPITAL_COMMUNITY)
Admission: RE | Disposition: A | Discharge status: Home / Self Care | Payer: Self-pay | Source: Ambulatory Visit | Attending: Orthopedic Surgery

## 2022-02-17 DIAGNOSIS — Z79899 Other long term (current) drug therapy: Secondary | ICD-10-CM | POA: Insufficient documentation

## 2022-02-17 DIAGNOSIS — M1711 Unilateral primary osteoarthritis, right knee: Principal | ICD-10-CM | POA: Insufficient documentation

## 2022-02-17 DIAGNOSIS — Z7982 Long term (current) use of aspirin: Secondary | ICD-10-CM | POA: Diagnosis not present

## 2022-02-17 DIAGNOSIS — Z01818 Encounter for other preprocedural examination: Secondary | ICD-10-CM

## 2022-02-17 DIAGNOSIS — N183 Chronic kidney disease, stage 3 unspecified: Secondary | ICD-10-CM | POA: Insufficient documentation

## 2022-02-17 DIAGNOSIS — E039 Hypothyroidism, unspecified: Secondary | ICD-10-CM | POA: Diagnosis not present

## 2022-02-17 DIAGNOSIS — Z96651 Presence of right artificial knee joint: Secondary | ICD-10-CM

## 2022-02-17 DIAGNOSIS — I129 Hypertensive chronic kidney disease with stage 1 through stage 4 chronic kidney disease, or unspecified chronic kidney disease: Secondary | ICD-10-CM | POA: Insufficient documentation

## 2022-02-17 DIAGNOSIS — I2510 Atherosclerotic heart disease of native coronary artery without angina pectoris: Secondary | ICD-10-CM

## 2022-02-17 HISTORY — PX: TOTAL KNEE ARTHROPLASTY: SHX125

## 2022-02-17 LAB — ABO/RH: ABO/RH(D): O POS

## 2022-02-17 SURGERY — ARTHROPLASTY, KNEE, TOTAL
Anesthesia: Spinal | Site: Knee | Laterality: Right

## 2022-02-17 MED ORDER — MIDAZOLAM HCL 2 MG/2ML IJ SOLN: 1.0000 mg | INTRAMUSCULAR | Status: DC

## 2022-02-17 MED ORDER — KETOROLAC TROMETHAMINE 30 MG/ML IJ SOLN
INTRAMUSCULAR | Status: DC | PRN
  Administered 2022-02-17: 30 mg

## 2022-02-17 MED ORDER — MECLIZINE HCL 25 MG PO TABS
12.5000 mg | ORAL_TABLET | Freq: Three times a day (TID) | ORAL | Status: DC | PRN
  Administered 2022-02-18 – 2022-02-19 (×3): 25 mg via ORAL
  Filled 2022-02-17 (×3): qty 1

## 2022-02-17 MED ORDER — LEVOTHYROXINE SODIUM 88 MCG PO TABS
88.0000 ug | ORAL_TABLET | Freq: Every day | ORAL | Status: DC
  Administered 2022-02-18 – 2022-02-19 (×2): 88 ug via ORAL
  Filled 2022-02-17 (×2): qty 1

## 2022-02-17 MED ORDER — DEXAMETHASONE SODIUM PHOSPHATE 10 MG/ML IJ SOLN
10.0000 mg | Freq: Once | INTRAMUSCULAR | Status: AC
  Administered 2022-02-18: 10 mg via INTRAVENOUS
  Filled 2022-02-17: qty 1

## 2022-02-17 MED ORDER — DIPHENHYDRAMINE HCL 12.5 MG/5ML PO ELIX
12.5000 mg | ORAL_SOLUTION | ORAL | Status: DC | PRN
  Administered 2022-02-17: 25 mg via ORAL
  Filled 2022-02-17: qty 10

## 2022-02-17 MED ORDER — ZONISAMIDE 100 MG PO CAPS
100.0000 mg | ORAL_CAPSULE | Freq: Every day | ORAL | Status: DC
  Administered 2022-02-17 – 2022-02-19 (×3): 100 mg via ORAL
  Filled 2022-02-17 (×3): qty 1

## 2022-02-17 MED ORDER — DEXAMETHASONE SODIUM PHOSPHATE 10 MG/ML IJ SOLN
8.0000 mg | Freq: Once | INTRAMUSCULAR | Status: AC
  Administered 2022-02-17: 8 mg via INTRAVENOUS

## 2022-02-17 MED ORDER — ROSUVASTATIN CALCIUM 20 MG PO TABS
40.0000 mg | ORAL_TABLET | Freq: Every day | ORAL | Status: DC
  Administered 2022-02-17 – 2022-02-19 (×3): 40 mg via ORAL
  Filled 2022-02-17 (×3): qty 2

## 2022-02-17 MED ORDER — RIVAROXABAN 10 MG PO TABS
10.0000 mg | ORAL_TABLET | Freq: Every day | ORAL | Status: DC
  Administered 2022-02-18 – 2022-02-19 (×2): 10 mg via ORAL
  Filled 2022-02-17 (×2): qty 1

## 2022-02-17 MED ORDER — METHOCARBAMOL 500 MG IVPB - SIMPLE MED: 500.0000 mg | Freq: Four times a day (QID) | INTRAVENOUS | Status: DC | PRN

## 2022-02-17 MED ORDER — ONDANSETRON HCL 4 MG/2ML IJ SOLN
INTRAMUSCULAR | Status: DC | PRN
  Administered 2022-02-17: 4 mg via INTRAVENOUS

## 2022-02-17 MED ORDER — ONDANSETRON HCL 4 MG/2ML IJ SOLN: 4.0000 mg | Freq: Once | INTRAMUSCULAR | Status: DC | PRN

## 2022-02-17 MED ORDER — MENTHOL 3 MG MT LOZG: 1.0000 | LOZENGE | OROMUCOSAL | Status: DC | PRN

## 2022-02-17 MED ORDER — PHENOL 1.4 % MT LIQD: 1.0000 | OROMUCOSAL | Status: DC | PRN

## 2022-02-17 MED ORDER — FENTANYL CITRATE PF 50 MCG/ML IJ SOSY: 50.0000 ug | PREFILLED_SYRINGE | INTRAMUSCULAR | Status: DC

## 2022-02-17 MED ORDER — OMEPRAZOLE MAGNESIUM 20 MG PO TBEC: 20.0000 mg | DELAYED_RELEASE_TABLET | Freq: Every day | ORAL | Status: DC

## 2022-02-17 MED ORDER — ORAL CARE MOUTH RINSE: 15.0000 mL | Freq: Once | OROMUCOSAL | Status: AC

## 2022-02-17 MED ORDER — POVIDONE-IODINE 10 % EX SWAB: 2.0000 "application " | Freq: Once | CUTANEOUS | Status: DC

## 2022-02-17 MED ORDER — 0.9 % SODIUM CHLORIDE (POUR BTL) OPTIME
TOPICAL | Status: DC | PRN
  Administered 2022-02-17: 1000 mL

## 2022-02-17 MED ORDER — DOCUSATE SODIUM 100 MG PO CAPS
100.0000 mg | ORAL_CAPSULE | Freq: Two times a day (BID) | ORAL | Status: DC
  Administered 2022-02-17 – 2022-02-19 (×4): 100 mg via ORAL
  Filled 2022-02-17 (×4): qty 1

## 2022-02-17 MED ORDER — LOSARTAN POTASSIUM 50 MG PO TABS
50.0000 mg | ORAL_TABLET | Freq: Every day | ORAL | Status: DC
  Administered 2022-02-18 – 2022-02-19 (×2): 50 mg via ORAL
  Filled 2022-02-17 (×2): qty 1

## 2022-02-17 MED ORDER — POVIDONE-IODINE 10 % EX SWAB
2.0000 | Freq: Once | CUTANEOUS | Status: AC
  Administered 2022-02-17: 2 via TOPICAL

## 2022-02-17 MED ORDER — LACTATED RINGERS IV SOLN: INTRAVENOUS | Status: DC

## 2022-02-17 MED ORDER — METHOCARBAMOL 500 MG PO TABS
500.0000 mg | ORAL_TABLET | Freq: Four times a day (QID) | ORAL | Status: DC | PRN
  Administered 2022-02-17 – 2022-02-19 (×4): 500 mg via ORAL
  Filled 2022-02-17 (×4): qty 1

## 2022-02-17 MED ORDER — CHLORHEXIDINE GLUCONATE 0.12 % MT SOLN
15.0000 mL | Freq: Once | OROMUCOSAL | Status: AC
  Administered 2022-02-17: 15 mL via OROMUCOSAL

## 2022-02-17 MED ORDER — FERROUS SULFATE 325 (65 FE) MG PO TABS
325.0000 mg | ORAL_TABLET | Freq: Three times a day (TID) | ORAL | Status: DC
  Filled 2022-02-17: qty 1

## 2022-02-17 MED ORDER — SODIUM CHLORIDE (PF) 0.9 % IJ SOLN
INTRAMUSCULAR | Status: AC
  Filled 2022-02-17: qty 30

## 2022-02-17 MED ORDER — MIDAZOLAM HCL 2 MG/2ML IJ SOLN: 2.0000 mg | Freq: Once | INTRAMUSCULAR | Status: AC

## 2022-02-17 MED ORDER — PROPOFOL 10 MG/ML IV BOLUS
INTRAVENOUS | Status: DC | PRN
  Administered 2022-02-17: 30 mg via INTRAVENOUS
  Administered 2022-02-17: 20 mg via INTRAVENOUS

## 2022-02-17 MED ORDER — HYDROMORPHONE HCL 2 MG PO TABS
2.0000 mg | ORAL_TABLET | ORAL | Status: DC | PRN
  Administered 2022-02-19: 2 mg via ORAL
  Filled 2022-02-17 (×3): qty 1

## 2022-02-17 MED ORDER — FENTANYL CITRATE PF 50 MCG/ML IJ SOSY: 25.0000 ug | PREFILLED_SYRINGE | INTRAMUSCULAR | Status: DC | PRN

## 2022-02-17 MED ORDER — ACETAMINOPHEN 500 MG PO TABS
1000.0000 mg | ORAL_TABLET | Freq: Once | ORAL | Status: AC
  Administered 2022-02-17: 1000 mg via ORAL
  Filled 2022-02-17: qty 2

## 2022-02-17 MED ORDER — BUPIVACAINE-EPINEPHRINE (PF) 0.25% -1:200000 IJ SOLN
INTRAMUSCULAR | Status: AC
  Filled 2022-02-17: qty 30

## 2022-02-17 MED ORDER — PANTOPRAZOLE SODIUM 40 MG PO TBEC
40.0000 mg | DELAYED_RELEASE_TABLET | Freq: Every day | ORAL | Status: DC
  Administered 2022-02-18 – 2022-02-19 (×2): 40 mg via ORAL
  Filled 2022-02-17 (×2): qty 1

## 2022-02-17 MED ORDER — SODIUM CHLORIDE 0.9 % IR SOLN
Status: DC | PRN
  Administered 2022-02-17: 1000 mL

## 2022-02-17 MED ORDER — PHENYLEPHRINE 80 MCG/ML (10ML) SYRINGE FOR IV PUSH (FOR BLOOD PRESSURE SUPPORT)
PREFILLED_SYRINGE | INTRAVENOUS | Status: DC | PRN
  Administered 2022-02-17: 80 ug via INTRAVENOUS
  Administered 2022-02-17: 120 ug via INTRAVENOUS

## 2022-02-17 MED ORDER — KETOROLAC TROMETHAMINE 30 MG/ML IJ SOLN
INTRAMUSCULAR | Status: AC
  Filled 2022-02-17: qty 1

## 2022-02-17 MED ORDER — FENTANYL CITRATE PF 50 MCG/ML IJ SOSY: 50.0000 ug | PREFILLED_SYRINGE | Freq: Once | INTRAMUSCULAR | Status: AC

## 2022-02-17 MED ORDER — BUPIVACAINE IN DEXTROSE 0.75-8.25 % IT SOLN
INTRATHECAL | Status: DC | PRN
  Administered 2022-02-17: 1.6 mL via INTRATHECAL

## 2022-02-17 MED ORDER — CELECOXIB 200 MG PO CAPS
200.0000 mg | ORAL_CAPSULE | Freq: Two times a day (BID) | ORAL | Status: DC
Start: 2022-02-17 — End: 2022-02-19
  Administered 2022-02-17 – 2022-02-19 (×4): 200 mg via ORAL
  Filled 2022-02-17 (×4): qty 1

## 2022-02-17 MED ORDER — CEFAZOLIN SODIUM-DEXTROSE 2-4 GM/100ML-% IV SOLN
2.0000 g | Freq: Four times a day (QID) | INTRAVENOUS | Status: AC
  Administered 2022-02-17 – 2022-02-18 (×2): 2 g via INTRAVENOUS
  Filled 2022-02-17 (×2): qty 100

## 2022-02-17 MED ORDER — SODIUM CHLORIDE 0.9 % IV SOLN: INTRAVENOUS | Status: DC

## 2022-02-17 MED ORDER — ONDANSETRON HCL 4 MG PO TABS
4.0000 mg | ORAL_TABLET | Freq: Four times a day (QID) | ORAL | Status: DC | PRN
  Administered 2022-02-18 – 2022-02-19 (×4): 4 mg via ORAL
  Filled 2022-02-17 (×4): qty 1

## 2022-02-17 MED ORDER — SODIUM CHLORIDE (PF) 0.9 % IJ SOLN
INTRAMUSCULAR | Status: DC | PRN
  Administered 2022-02-17: 30 mL

## 2022-02-17 MED ORDER — ROPIVACAINE HCL 5 MG/ML IJ SOLN
INTRAMUSCULAR | Status: DC | PRN
  Administered 2022-02-17: 30 mL via PERINEURAL

## 2022-02-17 MED ORDER — METOCLOPRAMIDE HCL 5 MG/ML IJ SOLN
5.0000 mg | Freq: Three times a day (TID) | INTRAMUSCULAR | Status: DC | PRN
  Administered 2022-02-17 – 2022-02-18 (×2): 10 mg via INTRAVENOUS
  Filled 2022-02-17 (×2): qty 2

## 2022-02-17 MED ORDER — BISACODYL 10 MG RE SUPP: 10.0000 mg | Freq: Every day | RECTAL | Status: DC | PRN

## 2022-02-17 MED ORDER — TRANEXAMIC ACID-NACL 1000-0.7 MG/100ML-% IV SOLN
1000.0000 mg | INTRAVENOUS | Status: AC
  Administered 2022-02-17: 1000 mg via INTRAVENOUS
  Filled 2022-02-17: qty 100

## 2022-02-17 MED ORDER — ACETAMINOPHEN 325 MG PO TABS
325.0000 mg | ORAL_TABLET | Freq: Four times a day (QID) | ORAL | Status: DC | PRN
  Administered 2022-02-18 – 2022-02-19 (×3): 650 mg via ORAL
  Filled 2022-02-17 (×3): qty 2

## 2022-02-17 MED ORDER — CEFAZOLIN SODIUM-DEXTROSE 2-4 GM/100ML-% IV SOLN
2.0000 g | INTRAVENOUS | Status: AC
  Administered 2022-02-17: 2 g via INTRAVENOUS
  Filled 2022-02-17: qty 100

## 2022-02-17 MED ORDER — HYDROMORPHONE HCL 2 MG PO TABS
1.0000 mg | ORAL_TABLET | ORAL | Status: DC | PRN
  Administered 2022-02-18 – 2022-02-19 (×7): 2 mg via ORAL
  Filled 2022-02-17 (×5): qty 1

## 2022-02-17 MED ORDER — METOCLOPRAMIDE HCL 5 MG PO TABS: 5.0000 mg | ORAL_TABLET | Freq: Three times a day (TID) | ORAL | Status: DC | PRN

## 2022-02-17 MED ORDER — FENTANYL CITRATE PF 50 MCG/ML IJ SOSY
PREFILLED_SYRINGE | INTRAMUSCULAR | Status: AC
  Administered 2022-02-17: 50 ug via INTRAVENOUS
  Filled 2022-02-17: qty 2

## 2022-02-17 MED ORDER — MIDAZOLAM HCL 2 MG/2ML IJ SOLN
INTRAMUSCULAR | Status: AC
  Administered 2022-02-17: 2 mg via INTRAVENOUS
  Filled 2022-02-17: qty 2

## 2022-02-17 MED ORDER — BUPIVACAINE-EPINEPHRINE (PF) 0.25% -1:200000 IJ SOLN
INTRAMUSCULAR | Status: DC | PRN
  Administered 2022-02-17: 30 mL

## 2022-02-17 MED ORDER — HYDROMORPHONE HCL 1 MG/ML IJ SOLN
0.5000 mg | INTRAMUSCULAR | Status: DC | PRN
  Administered 2022-02-17: 1 mg via INTRAVENOUS
  Filled 2022-02-17: qty 1

## 2022-02-17 MED ORDER — PROPOFOL 500 MG/50ML IV EMUL
INTRAVENOUS | Status: DC | PRN
  Administered 2022-02-17: 100 ug/kg/min via INTRAVENOUS

## 2022-02-17 MED ORDER — TRANEXAMIC ACID-NACL 1000-0.7 MG/100ML-% IV SOLN
1000.0000 mg | Freq: Once | INTRAVENOUS | Status: AC
  Administered 2022-02-17: 1000 mg via INTRAVENOUS
  Filled 2022-02-17: qty 100

## 2022-02-17 MED ORDER — ACYCLOVIR 400 MG PO TABS: 400.0000 mg | ORAL_TABLET | Freq: Three times a day (TID) | ORAL | Status: DC | PRN

## 2022-02-17 MED ORDER — ONDANSETRON HCL 4 MG/2ML IJ SOLN
4.0000 mg | Freq: Four times a day (QID) | INTRAMUSCULAR | Status: DC | PRN
  Administered 2022-02-18: 4 mg via INTRAVENOUS
  Filled 2022-02-17: qty 2

## 2022-02-17 MED ORDER — POLYETHYLENE GLYCOL 3350 17 G PO PACK: 17.0000 g | PACK | Freq: Every day | ORAL | Status: DC | PRN

## 2022-02-17 MED ORDER — AMPHETAMINE-DEXTROAMPHETAMINE 10 MG PO TABS
10.0000 mg | ORAL_TABLET | Freq: Two times a day (BID) | ORAL | Status: DC | PRN
  Filled 2022-02-17: qty 1

## 2022-02-17 SURGICAL SUPPLY — 57 items
ATTUNE MED ANAT PAT 32 KNEE (Knees) ×1 IMPLANT
ATTUNE PSFEM RTSZ4 NARCEM KNEE (Femur) ×1 IMPLANT
ATTUNE PSRP INSR SZ4 5 KNEE (Insert) ×1 IMPLANT
BAG COUNTER SPONGE SURGICOUNT (BAG) ×1 IMPLANT
BAG ZIPLOCK 12X15 (MISCELLANEOUS) ×1 IMPLANT
BASE TIBIAL ROT PLAT SZ 3 KNEE (Knees) IMPLANT
BLADE SAW SGTL 11.0X1.19X90.0M (BLADE) IMPLANT
BLADE SAW SGTL 13.0X1.19X90.0M (BLADE) ×2 IMPLANT
BNDG ELASTIC 6X5.8 VLCR STR LF (GAUZE/BANDAGES/DRESSINGS) ×2 IMPLANT
BOWL SMART MIX CTS (DISPOSABLE) ×2 IMPLANT
CEMENT HV SMART SET (Cement) ×2 IMPLANT
CUFF TOURN SGL QUICK 34 (TOURNIQUET CUFF) ×2
CUFF TRNQT CYL 34X4.125X (TOURNIQUET CUFF) ×1 IMPLANT
DERMABOND ADVANCED (GAUZE/BANDAGES/DRESSINGS) ×1
DERMABOND ADVANCED .7 DNX12 (GAUZE/BANDAGES/DRESSINGS) ×1 IMPLANT
DRAPE SHEET LG 3/4 BI-LAMINATE (DRAPES) ×2 IMPLANT
DRAPE U-SHAPE 47X51 STRL (DRAPES) ×2 IMPLANT
DRESSING AQUACEL AG SP 3.5X10 (GAUZE/BANDAGES/DRESSINGS) ×1 IMPLANT
DRSG AQUACEL AG SP 3.5X10 (GAUZE/BANDAGES/DRESSINGS) ×2
DURAPREP 26ML APPLICATOR (WOUND CARE) ×4 IMPLANT
ELECT REM PT RETURN 15FT ADLT (MISCELLANEOUS) ×2 IMPLANT
FACESHIELD WRAPAROUND (MASK) ×2 IMPLANT
FACESHIELD WRAPAROUND OR TEAM (MASK) ×1 IMPLANT
GLOVE BIO SURGEON STRL SZ 6 (GLOVE) ×2 IMPLANT
GLOVE BIOGEL PI IND STRL 6.5 (GLOVE) ×1 IMPLANT
GLOVE BIOGEL PI IND STRL 7.5 (GLOVE) ×1 IMPLANT
GLOVE BIOGEL PI INDICATOR 6.5 (GLOVE) ×1
GLOVE BIOGEL PI INDICATOR 7.5 (GLOVE) ×1
GLOVE ORTHO TXT STRL SZ7.5 (GLOVE) ×4 IMPLANT
GOWN STRL REUS W/ TWL LRG LVL3 (GOWN DISPOSABLE) ×3 IMPLANT
GOWN STRL REUS W/TWL LRG LVL3 (GOWN DISPOSABLE) ×6
HANDPIECE INTERPULSE COAX TIP (DISPOSABLE) ×2
HOLDER FOLEY CATH W/STRAP (MISCELLANEOUS) ×1 IMPLANT
KIT TURNOVER KIT A (KITS) IMPLANT
MANIFOLD NEPTUNE II (INSTRUMENTS) ×2 IMPLANT
NDL SAFETY ECLIPSE 18X1.5 (NEEDLE) IMPLANT
NEEDLE HYPO 18GX1.5 SHARP (NEEDLE) ×2
NS IRRIG 1000ML POUR BTL (IV SOLUTION) ×2 IMPLANT
PACK TOTAL KNEE CUSTOM (KITS) ×2 IMPLANT
PROTECTOR NERVE ULNAR (MISCELLANEOUS) ×2 IMPLANT
SET HNDPC FAN SPRY TIP SCT (DISPOSABLE) ×1 IMPLANT
SET PAD KNEE POSITIONER (MISCELLANEOUS) ×2 IMPLANT
SPIKE FLUID TRANSFER (MISCELLANEOUS) ×2 IMPLANT
SUT MNCRL AB 4-0 PS2 18 (SUTURE) ×2 IMPLANT
SUT STRATAFIX PDS+ 0 24IN (SUTURE) ×2 IMPLANT
SUT VIC AB 1 CT1 36 (SUTURE) ×2 IMPLANT
SUT VIC AB 2-0 CT1 27 (SUTURE) ×4
SUT VIC AB 2-0 CT1 TAPERPNT 27 (SUTURE) ×2 IMPLANT
SYR 3ML LL SCALE MARK (SYRINGE) ×2 IMPLANT
TIBIAL BASE ROT PLAT SZ 3 KNEE (Knees) ×2 IMPLANT
TOWEL GREEN STERILE FF (TOWEL DISPOSABLE) ×2 IMPLANT
TRAY CATH INTERMITTENT SS 16FR (CATHETERS) ×2 IMPLANT
TRAY FOLEY MTR SLVR 14FR STAT (SET/KITS/TRAYS/PACK) ×1 IMPLANT
TRAY FOLEY MTR SLVR 16FR STAT (SET/KITS/TRAYS/PACK) ×1 IMPLANT
TUBE SUCTION HIGH CAP CLEAR NV (SUCTIONS) ×2 IMPLANT
WATER STERILE IRR 1000ML POUR (IV SOLUTION) ×4 IMPLANT
WRAP KNEE MAXI GEL POST OP (GAUZE/BANDAGES/DRESSINGS) ×2 IMPLANT

## 2022-02-17 NOTE — Anesthesia Procedure Notes (Signed)
Anesthesia Regional Block: Adductor canal block   Pre-Anesthetic Checklist: , timeout performed,  Correct Patient, Correct Site, Correct Laterality,  Correct Procedure, Correct Position, site marked,  Risks and benefits discussed,  Surgical consent,  Pre-op evaluation,  At surgeon's request and post-op pain management  Laterality: Right  Prep: chloraprep       Needles:  Injection technique: Single-shot  Needle Type: Echogenic Needle     Needle Length: 9cm  Needle Gauge: 21     Additional Needles:   Procedures:,,,, ultrasound used (permanent image in chart),,    Narrative:  Start time: 02/17/2022 3:12 PM End time: 02/17/2022 3:18 PM Injection made incrementally with aspirations every 5 mL.  Performed by: Personally  Anesthesiologist: Collene Schlichter, MD  Additional Notes: No pain on injection. No increased resistance to injection. Injection made in 5cc increments.  Good needle visualization.  Patient tolerated procedure well.

## 2022-02-17 NOTE — Transfer of Care (Signed)
Immediate Anesthesia Transfer of Care Note  Patient: Brittany Todd  Procedure(s) Performed: TOTAL KNEE ARTHROPLASTY (Right: Knee)  Patient Location: PACU  Anesthesia Type:Spinal  Level of Consciousness: awake  Airway & Oxygen Therapy: Patient Spontanous Breathing and Patient connected to face mask oxygen  Post-op Assessment: Report given to RN and Post -op Vital signs reviewed and stable  Post vital signs: Reviewed and stable  Last Vitals:  Vitals Value Taken Time  BP 128/73 02/17/22 1736  Temp    Pulse 74 02/17/22 1738  Resp 10 02/17/22 1738  SpO2 100 % 02/17/22 1738  Vitals shown include unvalidated device data.  Last Pain:  Vitals:   02/17/22 1401  TempSrc:   PainSc: 2       Patients Stated Pain Goal: 4 (02/17/22 1401)  Complications: No notable events documented.

## 2022-02-17 NOTE — Discharge Instructions (Addendum)

## 2022-02-17 NOTE — Anesthesia Preprocedure Evaluation (Signed)
Anesthesia Evaluation  Patient identified by MRN, date of birth, ID band Patient awake    Reviewed: Allergy & Precautions, NPO status , Patient's Chart, lab work & pertinent test results  History of Anesthesia Complications (+) PONV and history of anesthetic complications  Airway Mallampati: II  TM Distance: <3 FB Neck ROM: Full    Dental  (+) Teeth Intact, Dental Advisory Given   Pulmonary sleep apnea ,    Pulmonary exam normal breath sounds clear to auscultation       Cardiovascular hypertension, Pt. on medications Normal cardiovascular exam Rhythm:Regular Rate:Normal     Neuro/Psych  Headaches, PSYCHIATRIC DISORDERS Anxiety    GI/Hepatic Neg liver ROS, GERD  Medicated,  Endo/Other  Hypothyroidism Obesity   Renal/GU Renal InsufficiencyRenal disease     Musculoskeletal  (+) Arthritis ,   Abdominal   Peds  (+) ATTENTION DEFICIT DISORDER WITHOUT HYPERACTIVITY Hematology negative hematology ROS (+) Factor V Leiden   Anesthesia Other Findings Day of surgery medications reviewed with the patient.  Reproductive/Obstetrics                             Anesthesia Physical Anesthesia Plan  ASA: 2  Anesthesia Plan: Spinal   Post-op Pain Management: Tylenol PO (pre-op)* and Regional block*   Induction: Intravenous  PONV Risk Score and Plan: 3 and TIVA, Dexamethasone and Ondansetron  Airway Management Planned: Nasal Cannula and Natural Airway  Additional Equipment:   Intra-op Plan:   Post-operative Plan:   Informed Consent: I have reviewed the patients History and Physical, chart, labs and discussed the procedure including the risks, benefits and alternatives for the proposed anesthesia with the patient or authorized representative who has indicated his/her understanding and acceptance.     Dental advisory given  Plan Discussed with: CRNA, Anesthesiologist and  Surgeon  Anesthesia Plan Comments: (Discussed risks and benefits of and differences between spinal and general. Discussed risks of spinal including headache, backache, failure, bleeding, infection, and nerve damage. Patient consents to spinal. Questions answered. Coagulation studies and platelet count acceptable.)        Anesthesia Quick Evaluation

## 2022-02-17 NOTE — Interval H&P Note (Signed)
History and Physical Interval Note:  02/17/2022 2:40 PM  Brittany Todd  has presented today for surgery, with the diagnosis of Right knee osteoarthritis.  The various methods of treatment have been discussed with the patient and family. After consideration of risks, benefits and other options for treatment, the patient has consented to  Procedure(s): TOTAL KNEE ARTHROPLASTY (Right) as a surgical intervention.  The patient's history has been reviewed, patient examined, no change in status, stable for surgery.  I have reviewed the patient's chart and labs.  Questions were answered to the patient's satisfaction.     [name]

## 2022-02-17 NOTE — Anesthesia Postprocedure Evaluation (Signed)
Anesthesia Post Note  Patient: Brittany Todd  Procedure(s) Performed: TOTAL KNEE ARTHROPLASTY (Right: Knee)     Patient location during evaluation: PACU Anesthesia Type: Spinal Level of consciousness: awake, awake and alert and oriented Pain management: pain level controlled Vital Signs Assessment: post-procedure vital signs reviewed and stable Respiratory status: spontaneous breathing, nonlabored ventilation and respiratory function stable Cardiovascular status: blood pressure returned to baseline and stable Postop Assessment: no headache, no backache, spinal receding and no apparent nausea or vomiting Anesthetic complications: no   No notable events documented.  Last Vitals:  Vitals:   02/17/22 1830 02/17/22 1846  BP: 132/89 139/87  Pulse: 72 60  Resp: 16 16  Temp:  36.5 C  SpO2: 100% 100%    Last Pain:  Vitals:   02/17/22 1846  TempSrc: Axillary  PainSc: 0-No pain                 Collene Schlichter

## 2022-02-17 NOTE — Anesthesia Procedure Notes (Addendum)
Spinal  Patient location during procedure: OR Start time: 02/17/2022 3:52 PM End time: 02/17/2022 3:56 PM Reason for block: surgical anesthesia Staffing Performed: resident/CRNA  Resident/CRNA: Ezekiel Ina, CRNA Performed by: Ezekiel Ina, CRNA Authorized by: Collene Schlichter, MD   Preanesthetic Checklist Completed: patient identified, IV checked, site marked, risks and benefits discussed, surgical consent, monitors and equipment checked, pre-op evaluation and timeout performed Spinal Block Patient position: sitting Prep: DuraPrep and site prepped and draped Patient monitoring: heart rate, cardiac monitor, continuous pulse ox and blood pressure Approach: midline Location: L3-4 Injection technique: single-shot Needle Needle type: Pencan  Needle gauge: 24 G Needle length: 10 cm Assessment Sensory level: T6 Events: CSF return Additional Notes Functioning IV was confirmed and monitors were applied. Sterile prep and drape, including hand hygiene and sterile gloves were used. The patient was positioned and the spine was prepped. The skin was anesthetized with lidocaine.  Free flow of clear CSF was obtained prior to injecting local anesthetic into the CSF.  The spinal needle aspirated freely following injection.  The needle was carefully withdrawn.  The patient tolerated the procedure well.

## 2022-02-17 NOTE — Op Note (Signed)
NAME:  Brittany Todd                      MEDICAL RECORD NO.:  [id number]                             FACILITY:  [facility]      PHYSICIAN:  Madlyn Frankel. Charlann Boxer, M.D.  DATE OF BIRTH:  25-Jan-1961      DATE OF PROCEDURE:  02/17/2022                                     OPERATIVE REPORT         PREOPERATIVE DIAGNOSIS:  Right knee osteoarthritis.      POSTOPERATIVE DIAGNOSIS:  Right knee osteoarthritis.      FINDINGS:  The patient was noted to have complete loss of cartilage and   bone-on-bone arthritis with associated osteophytes in the medial and patellofemoral compartments of   the knee with a significant synovitis and associated effusion.  The patient had failed months of conservative treatment including medications, injection therapy, activity modification.     PROCEDURE:  Right total knee replacement.      COMPONENTS USED:  DePuy Attune rotating platform posterior stabilized knee   system, a size 4N femur, 3 tibia, size size 5 mm PS AOX insert, and 32 anatomic patellar   button.      SURGEON:  Madlyn Frankel. Charlann Boxer, M.D.      ASSISTANT:  Rosalene Billings, PA-C.      ANESTHESIA:  Regional and Spinal.      SPECIMENS:  None.      COMPLICATION:  None.      DRAINS:  None.  EBL: <150 cc      TOURNIQUET TIME:  26 min at 225 mmHg     The patient was stable to the recovery room.      INDICATION FOR PROCEDURE:  Brittany Todd is a 61 y.o. female patient of   mine.  The patient had been seen, evaluated, and treated for months conservatively in the   office with medication, activity modification, and injections.  The patient had   radiographic changes of bone-on-bone arthritis with endplate sclerosis and osteophytes noted.  Based on the radiographic changes and failed conservative measures, the patient   decided to proceed with definitive treatment, total knee replacement.  Risks of infection, DVT, component failure, need for revision surgery, neurovascular injury were reviewed in the office  setting.  The postop course was reviewed stressing the efforts to maximize post-operative satisfaction and function.  Consent was obtained for benefit of pain   relief.      PROCEDURE IN DETAIL:  The patient was brought to the operative theater.   Once adequate anesthesia, preoperative antibiotics, 2 gm of Ancef,1 gm of Tranexamic Acid, and 10 mg of Decadron administered, the patient was positioned supine with a right thigh tourniquet placed.  The  right lower extremity was prepped and draped in sterile fashion.  A time-   out was performed identifying the patient, planned procedure, and the appropriate extremity.      The right lower extremity was placed in the [facility] leg holder.  The leg was   exsanguinated, tourniquet elevated to 250 mmHg.  A midline incision was   made followed by median parapatellar arthrotomy.  Following initial   exposure, attention  was first directed to the patella.  Precut   measurement was noted to be 21 mm.  I resected down to 13 mm and used a   31 anatomic patellar button to restore patellar height as well as cover the cut surface.      The lug holes were drilled and a metal shim was placed to protect the   patella from retractors and saw blade during the procedure.      At this point, attention was now directed to the femur.  The femoral   canal was opened with a drill, irrigated to try to prevent fat emboli.  An   intramedullary rod was passed at 3 degrees valgus, 9 mm of bone was   resected off the distal femur.  Following this resection, the tibia was   subluxated anteriorly.  Using the extramedullary guide, 2 mm of bone was resected off   the proximal medial tibia.  We confirmed the gap would be   stable medially and laterally with a size 5 spacer block as well as confirmed that the tibial cut was perpendicular in the coronal plane, checking with an alignment rod.      Once this was done, I sized the femur to be a size 4 in the anterior-   posterior  dimension, chose a narrow component based on medial and   lateral dimension.  The size 4 rotation block was then pinned in   position anterior referenced using the C-clamp to set rotation.  The   anterior, posterior, and  chamfer cuts were made without difficulty nor   notching making certain that I was along the anterior cortex to help   with flexion gap stability.      The final box cut was made off the lateral aspect of distal femur.      At this point, the tibia was sized to be a size 3.  The size 3 tray was   then pinned in position through the medial third of the tubercle,   drilled, and keel punched.  Trial reduction was now carried with a 4 femur,  3 tibia, a size size 5 mm PS insert, and the 32 anatomic patella botton.  The knee was brought to full extension with good flexion stability with the patella   tracking through the trochlea without application of pressure.  Given   all these findings the trial components removed.  Final components were   opened and cement was mixed.  The knee was irrigated with normal saline solution and pulse lavage.  The synovial lining was   then injected with 30 cc of 0.25% Marcaine with epinephrine, 1 cc of Toradol and 30 cc of NS for a total of 61 cc.     Final implants were then cemented onto cleaned and dried cut surfaces of bone with the knee brought to extension with a size size 5 mm PS trial insert.      Once the cement had fully cured, excess cement was removed   throughout the knee.  I confirmed that I was satisfied with the range of   motion and stability, and the final size size 5 mm PS AOX insert was chosen.  It was   placed into the knee.      The tourniquet had been let down at 26 minutes.  No significant   hemostasis was required.  The extensor mechanism was then reapproximated using #1 Vicryl and #1 Stratafix sutures with the knee   in  flexion.  The   remaining wound was closed with 2-0 Vicryl and running 4-0 Monocryl.   The knee  was cleaned, dried, dressed sterilely using Dermabond and   Aquacel dressing.  The patient was then   brought to recovery room in stable condition, tolerating the procedure   well.   Please note that Physician Assistant, Rosalene Billings, PA-C was present for the entirety of the case, and was utilized for pre-operative positioning, peri-operative retractor management, general facilitation of the procedure and for primary wound closure at the end of the case.              Madlyn Frankel Charlann Boxer, M.D.    02/17/2022 4:01 PM

## 2022-02-17 NOTE — H&P (Signed)
TOTAL KNEE ADMISSION H&P  Patient is being admitted for right total knee arthroplasty.  Subjective:  Chief Complaint:right knee pain.  HPI: Brittany Todd, 61 y.o. female, has a history of pain and functional disability in the right knee due to arthritis and has failed non-surgical conservative treatments for greater than 12 weeks to includeNSAID's and/or analgesics and activity modification.  Onset of symptoms was gradual, starting 3 years ago with gradually worsening course since that time. The patient noted prior procedures on the knee to include  arthroscopy and menisectomy on the right knee(s).  Patient currently rates pain in the right knee(s) at 8 out of 10 with activity. Patient has worsening of pain with activity and weight bearing and pain that interferes with activities of daily living.  Patient has evidence of joint space narrowing by imaging studies. There is no active infection.  Patient Active Problem List   Diagnosis Date Noted   OSA (obstructive sleep apnea) 12/23/2021   Left ureteral calculus 03/07/2018   Ingrown left greater toenail 02/03/2018   Chest pain, atypical 10/21/2017   Mixed dyslipidemia 10/21/2017   Hypothyroidism 10/19/2017   Menopausal symptom 10/19/2017   Chronic migraine 10/16/2017   CKD (chronic kidney disease) stage 3, GFR 30-59 ml/min (HCC) 05/11/2017   Obesity 05/11/2017   Other chest pain 12/18/2016   Hypothyroidism due to Hashimoto's thyroiditis 11/19/2016   Intractable migraine without aura and without status migrainosus 11/06/2016   Anxiety 03/10/2016   Attention deficit hyperactivity disorder 03/10/2016   Gastro-esophageal reflux disease with esophagitis 03/10/2016   Hypercholesterolemia 03/10/2016   Renal stone 03/10/2016   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   Vitamin D deficiency disease 03/10/2016   Past Medical History:  Diagnosis Date   ADD (attention deficit disorder)    Anxiety    Arthritis    Chest tightness    DOE (dyspnea on exertion)     Factor V Leiden (HCC)    GERD (gastroesophageal reflux disease)    Hashimoto's disease    Hyperlipemia    Hypertension    Hypothyroid    Migraines    PONV (postoperative nausea and vomiting)    Renal stone    TMJ (dislocation of temporomandibular joint)    Vitamin D deficiency     Past Surgical History:  Procedure Laterality Date   CESAREAN SECTION     COLONOSCOPY     CYSTOSCOPY W/ URETEROSCOPY     FINGER SURGERY Left    KNEE ARTHROSCOPY Right    TUBAL LIGATION     WISDOM TOOTH EXTRACTION      No current facility-administered medications for this encounter.   Current Outpatient Medications  Medication Sig Dispense Refill Last Dose   acyclovir (ZOVIRAX) 400 MG tablet Take 400 mg by mouth 3 (three) times daily as needed (outbreaks).      amphetamine-dextroamphetamine (ADDERALL) 10 MG tablet Take 10 mg by mouth 2 (two) times daily as needed (ADHD).      aspirin EC 81 MG tablet Take 81 mg by mouth daily.      baclofen (LIORESAL) 10 MG tablet Take 10 mg by mouth 2 (two) times daily as needed for muscle spasms.      CALCIUM PO Take 1 tablet by mouth daily.      cholecalciferol (VITAMIN D) 25 MCG (1000 UNIT) tablet Take 1,000 Units by mouth daily.      cyanocobalamin 1000 MCG tablet Take 1,000 mcg by mouth daily.      levothyroxine (SYNTHROID, LEVOTHROID) 88 MCG tablet Take 88 mcg  by mouth daily before breakfast.      losartan (COZAAR) 50 MG tablet Take 50 mg by mouth daily.      MAGNESIUM PO Take 1 tablet by mouth daily.      meclizine (ANTIVERT) 25 MG tablet Take 12.5-25 mg by mouth 3 (three) times daily as needed for nausea or dizziness.      Omega-3 Fatty Acids (FISH OIL) 1200 MG CAPS Take 3,600 mg by mouth daily.      omeprazole (PRILOSEC OTC) 20 MG tablet Take 20 mg by mouth daily as needed.      ondansetron (ZOFRAN-ODT) 4 MG disintegrating tablet Take 4 mg by mouth every 8 (eight) hours as needed for nausea/vomiting.      rosuvastatin (CRESTOR) 40 MG tablet rosuvastatin 40  mg tablet  TAKE 1 TABLET BY MOUTH ONCE DAILY      zinc gluconate 50 MG tablet Take 50 mg by mouth daily.      zonisamide (ZONEGRAN) 100 MG capsule Take 100 mg by mouth daily.      SUMAtriptan (IMITREX) 100 MG tablet Take 1 tablet (100 mg total) by mouth once as needed for up to 1 dose. May repeat in 2 hours if headache persists or recurs. (Patient not taking: Reported on 01/28/2022) 10 tablet 12 Not Taking   Allergies  Allergen Reactions   Ozempic (0.25 Or 0.5 Mg-Dose) [Semaglutide(0.25 Or 0.5mg -Dos)] Diarrhea and Nausea And Vomiting   Codeine Other (See Comments)    hallucinations   Cymbalta [Duloxetine Hcl] Other (See Comments)    lethargic   Morphine And Related Other (See Comments)    Hallucinations     Social History   Tobacco Use   Smoking status: Never   Smokeless tobacco: Never  Substance Use Topics   Alcohol use: No    Family History  Problem Relation Age of Onset   Heart attack Father    Alzheimer's disease Maternal Grandmother      Review of Systems  Constitutional:  Negative for chills and fever.  Respiratory:  Negative for cough and shortness of breath.   Cardiovascular:  Negative for chest pain.  Gastrointestinal:  Negative for nausea and vomiting.  Musculoskeletal:  Positive for arthralgias.     Objective:  Physical Exam Well nourished and well developed. General: Alert and oriented x3, cooperative and pleasant, no acute distress. Head: normocephalic, atraumatic, neck supple. Eyes: EOMI.  Musculoskeletal: Bilateral knee exams: No palpable effusions, warmth erythema Tenderness at the medial aspect of the joints Stable medial and lateral collateral ligaments Full knee extension with flexion with pain tightness and stiffness to 120 degrees   Calves soft and nontender. Motor function intact in LE. Strength 5/5 LE bilaterally. Neuro: Distal pulses 2+. Sensation to light touch intact in LE.  Vital signs in last 24 hours:    Labs:   Estimated  body mass index is 37.37 kg/m as calculated from the following:   Height as of 02/03/22: 4\' 11"  (1.499 m).   Weight as of 02/03/22: 83.9 kg.   Imaging Review Plain radiographs demonstrate severe degenerative joint disease of the right knee(s). The overall alignment isneutral. The bone quality appears to be adequate for age and reported activity level.      Assessment/Plan:  End stage arthritis, right knee   The patient history, physical examination, clinical judgment of the provider and imaging studies are consistent with end stage degenerative joint disease of the right knee(s) and total knee arthroplasty is deemed medically necessary. The treatment options including medical  management, injection therapy arthroscopy and arthroplasty were discussed at length. The risks and benefits of total knee arthroplasty were presented and reviewed. The risks due to aseptic loosening, infection, stiffness, patella tracking problems, thromboembolic complications and other imponderables were discussed. The patient acknowledged the explanation, agreed to proceed with the plan and consent was signed. Patient is being admitted for inpatient treatment for surgery, pain control, PT, OT, prophylactic antibiotics, VTE prophylaxis, progressive ambulation and ADL's and discharge planning. The patient is planning to be discharged  home.  Therapy Plans: outpatient therapy at [facility] Disposition: Home with sister for a few days, then husband afterwards Planned DVT Prophylaxis: Xarelto 10mg  daily (Factor 5 Leiden) DME needed: walker PCP: Legrand Rams, clearance received Pulmonologist: Dr. Elsworth Soho, clearance received TXA: IV Allergies: codeine & morphine - hallucinations after c-section Anesthesia Concerns: N/V BMI: 39.7 Last HgbA1c: Not diabetic   Other: - Hx of severe OSA does not have CPAP (cannot tolerate) - Dilaudid (nausea & did not tolerate Norco/Oxy), robaxin, tylenol, celebrex - Likely will need  zofran or phenergan - Factor 5 Leiden - May want a cortisone injection in the left knee at first post op - Getting repeat ESI   Patient's anticipated LOS is less than 2 midnights, meeting these requirements: - Younger than 64 - Lives within 1 hour of care - Has a competent adult at home to recover with post-op recover - NO history of  - Chronic pain requiring opiods  - Diabetes  - Coronary Artery Disease  - Heart failure  - Heart attack  - Stroke  - DVT/VTE  - Cardiac arrhythmia  - Respiratory Failure/COPD  - Renal failure  - Anemia  - Advanced Liver disease  Costella Hatcher, PA-C Orthopedic Surgery [facility] [place] [phone]

## 2022-02-18 ENCOUNTER — Encounter (HOSPITAL_COMMUNITY): Payer: Self-pay | Admitting: Orthopedic Surgery

## 2022-02-18 ENCOUNTER — Other Ambulatory Visit: Payer: Self-pay

## 2022-02-18 DIAGNOSIS — M1711 Unilateral primary osteoarthritis, right knee: Secondary | ICD-10-CM | POA: Diagnosis not present

## 2022-02-18 LAB — CBC
HCT: 41.2 % (ref 36.0–46.0)
Hemoglobin: 13.9 g/dL (ref 12.0–15.0)
MCH: 30.8 pg (ref 26.0–34.0)
MCHC: 33.7 g/dL (ref 30.0–36.0)
MCV: 91.2 fL (ref 80.0–100.0)
Platelets: 188 10*3/uL (ref 150–400)
RBC: 4.52 MIL/uL (ref 3.87–5.11)
RDW: 12 % (ref 11.5–15.5)
WBC: 12.8 10*3/uL — ABNORMAL HIGH (ref 4.0–10.5)
nRBC: 0 % (ref 0.0–0.2)

## 2022-02-18 LAB — BASIC METABOLIC PANEL
Anion gap: 7 (ref 5–15)
BUN: 12 mg/dL (ref 8–23)
CO2: 22 mmol/L (ref 22–32)
Calcium: 9.2 mg/dL (ref 8.9–10.3)
Chloride: 111 mmol/L (ref 98–111)
Creatinine, Ser: 0.94 mg/dL (ref 0.44–1.00)
GFR, Estimated: >60 mL/min (ref >60)
Glucose, Bld: 220 mg/dL — ABNORMAL HIGH (ref 70–99)
Potassium: 4.7 mmol/L (ref 3.5–5.1)
Sodium: 140 mmol/L (ref 135–145)

## 2022-02-18 MED ORDER — ACETAMINOPHEN 325 MG PO TABS: 1000.0000 mg | ORAL_TABLET | Freq: Four times a day (QID) | ORAL | Status: DC

## 2022-02-18 MED ORDER — ONDANSETRON 4 MG PO TBDP: 4.0000 mg | ORAL_TABLET | Freq: Three times a day (TID) | ORAL | 0 refills | Status: DC | PRN

## 2022-02-18 MED ORDER — DOCUSATE SODIUM 100 MG PO CAPS: 100.0000 mg | ORAL_CAPSULE | Freq: Two times a day (BID) | ORAL | 0 refills | Status: AC

## 2022-02-18 MED ORDER — METHOCARBAMOL 500 MG PO TABS: 500.0000 mg | ORAL_TABLET | Freq: Four times a day (QID) | ORAL | 0 refills | Status: DC | PRN

## 2022-02-18 MED ORDER — POLYETHYLENE GLYCOL 3350 17 G PO PACK: 17.0000 g | PACK | Freq: Every day | ORAL | 0 refills | Status: DC | PRN

## 2022-02-18 MED ORDER — HYDROMORPHONE HCL 2 MG PO TABS: 2.0000 mg | ORAL_TABLET | ORAL | 0 refills | Status: AC | PRN

## 2022-02-18 MED ORDER — RIVAROXABAN 10 MG PO TABS: 10.0000 mg | ORAL_TABLET | Freq: Every day | ORAL | 0 refills | Status: DC

## 2022-02-18 NOTE — Progress Notes (Addendum)
Subjective: 1 Day Post-Op Procedure(s) (LRB): TOTAL KNEE ARTHROPLASTY (Right) Patient reports pain as mild.   Patient seen in rounds with Dr. Charlann Boxer. Patient is well, and has had no acute complaints or problems. No acute events overnight. Foley catheter removed. She has felt nauseous last night and today. We will start therapy today.   Objective: Vital signs in last 24 hours: Temp:  [97.6 F (36.4 C)-98.4 F (36.9 C)] 98.4 F (36.9 C) (06/21 0534) Pulse Rate:  [60-89] 82 (06/21 0534) Resp:  [12-21] 19 (06/21 0534) BP: (103-143)/(73-103) 103/84 (06/21 0534) SpO2:  [91 %-100 %] 97 % (06/21 0534) Weight:  [83.9 kg] 83.9 kg (06/20 1401)  Intake/Output from previous day:  Intake/Output Summary (Last 24 hours) at 02/18/2022 0824 Last data filed at 02/18/2022 0717 Gross per 24 hour  Intake 2711.57 ml  Output 1415 ml  Net 1296.57 ml     Intake/Output this shift: Total I/O In: -  Out: 90 [Urine:90]  Labs: Recent Labs    02/18/22 0343  HGB 13.9   Recent Labs    02/18/22 0343  WBC 12.8*  RBC 4.52  HCT 41.2  PLT 188   Recent Labs    02/18/22 0343  NA 140  K 4.7  CL 111  CO2 22  BUN 12  CREATININE 0.94  GLUCOSE 220*  CALCIUM 9.2   No results for input(s): "LABPT", "INR" in the last 72 hours.  Exam: General - Patient is Alert and Oriented Extremity - Neurologically intact Sensation intact distally Intact pulses distally Dorsiflexion/Plantar flexion intact Dressing - dressing C/D/I Motor Function - intact, moving foot and toes well on exam.   Past Medical History:  Diagnosis Date   ADD (attention deficit disorder)    Anxiety    Arthritis    Chest tightness    DOE (dyspnea on exertion)    Factor V Leiden (HCC)    GERD (gastroesophageal reflux disease)    Hashimoto's disease    Hyperlipemia    Hypertension    Hypothyroid    Migraines    PONV (postoperative nausea and vomiting)    Renal stone    TMJ (dislocation of temporomandibular joint)     Vitamin D deficiency     Assessment/Plan: 1 Day Post-Op Procedure(s) (LRB): TOTAL KNEE ARTHROPLASTY (Right) Principal Problem:   S/P total knee arthroplasty, right  Estimated body mass index is 37.37 kg/m as calculated from the following:   Height as of this encounter: 4\' 11"  (1.499 m).   Weight as of this encounter: 83.9 kg. Advance diet Up with therapy D/C IV fluids   Patient's anticipated LOS is less than 2 midnights, meeting these requirements: - Younger than 78 - Lives within 1 hour of care - Has a competent adult at home to recover with post-op recover - NO history of  - Chronic pain requiring opiods  - Diabetes  - Coronary Artery Disease  - Heart failure  - Heart attack  - Stroke  - DVT/VTE  - Cardiac arrhythmia  - Respiratory Failure/COPD  - Renal failure  - Anemia  - Advanced Liver disease     DVT Prophylaxis - Xarelto Weight bearing as tolerated  Hgb stable at 13.9 this AM.  Dr. 74 discussed with her again the importance of following up with her sleep medicine provider about OSA treatment. I recommend she at least prop her head up at night for now.   Plan is to go Home after hospital stay. Plan for discharge today following 1-2 sessions  of PT as long as they are meeting their goals. Patient is scheduled for OPPT. Follow up in the office in 2 weeks.   Dennie Bible, PA-C Orthopedic Surgery [phone] 02/18/2022, 8:24 AM

## 2022-02-18 NOTE — Evaluation (Signed)
Physical Therapy Evaluation Patient Details Name: Brittany Todd MRN: [id number] DOB: Jan 18, 1961 Today's Date: 02/18/2022  History of Present Illness  Pt is a 61 year old female s/p right TKA on 02/17/22 with PMHx significant for but not limited to Hashimotos, Factor V Leiden, HTN, migraines, arthritis.  Clinical Impression  Pt is s/p TKA resulting in the deficits listed below (see PT Problem List).  Pt will benefit from skilled PT to increase their independence and safety with mobility to allow discharge to the venue listed below.  Pt assisted with stand and pivot to [facility].  Pt with decreased sensation over thigh and present with LE knee buckling.  Pt also reports hx of using pillow under knee prior to surgery due to pain.  Requested KI to assist with buckling and maintaining knee extension upon d/c.  Pt plans to d/c back home and reports having family assist.        Recommendations for follow up therapy are one component of a multi-disciplinary discharge planning process, led by the attending physician.  Recommendations may be updated based on patient status, additional functional criteria and insurance authorization.  Follow Up Recommendations Follow physician's recommendations for discharge plan and follow up therapies      Assistance Recommended at Discharge Frequent or constant Supervision/Assistance  Patient can return home with the following  A little help with walking and/or transfers;A little help with bathing/dressing/bathroom;Help with stairs or ramp for entrance    Equipment Recommendations None recommended by PT  Recommendations for Other Services       Functional Status Assessment Patient has had a recent decline in their functional status and demonstrates the ability to make significant improvements in function in a reasonable and predictable amount of time.     Precautions / Restrictions Precautions Precautions: Fall;Knee Restrictions Weight Bearing Restrictions:  No Other Position/Activity Restrictions: WBAT      Mobility  Bed Mobility Overal bed mobility: Needs Assistance Bed Mobility: Supine to Sit, Sit to Supine     Supine to sit: Supervision Sit to supine: Supervision   General bed mobility comments: cues for self assist and lines    Transfers Overall transfer level: Needs assistance Equipment used: Rolling walker (2 wheels) Transfers: Sit to/from Stand, Bed to chair/wheelchair/BSC Sit to Stand: Mod assist Stand pivot transfers: Mod assist         General transfer comment: verbal cues for safety and technique, pt with LE buckling in standing and requiring physical assist to prevent falls; only OOB to [facility] for safety    Ambulation/Gait               General Gait Details: deferred  Stairs            Wheelchair Mobility    Modified Rankin (Stroke Patients Only)       Balance                                             Pertinent Vitals/Pain Pain Assessment Pain Assessment: No/denies pain    Home Living Family/patient expects to be discharged to:: Private residence Living Arrangements: Spouse/significant other   Type of Home: House Home Access: Stairs to enter Entrance Stairs-Rails: None Entrance Stairs-Number of Steps: 1 and 1   Home Layout: One level Home Equipment: None      Prior Function Prior Level of Function : Independent/Modified Independent  Hand Dominance        Extremity/Trunk Assessment        Lower Extremity Assessment Lower Extremity Assessment: RLE deficits/detail RLE Deficits / Details: decreased sensation, pt denies pain, requiring assist to lift and observed knee buckling with standing, pt has been placing pillow under knee prior to surgery       Communication   Communication: No difficulties  Cognition Arousal/Alertness: Awake/alert Behavior During Therapy: WFL for tasks assessed/performed Overall Cognitive Status:  Within Functional Limits for tasks assessed                                          General Comments      Exercises     Assessment/Plan    PT Assessment Patient needs continued PT services  PT Problem List Decreased strength;Decreased balance;Decreased mobility;Decreased safety awareness;Decreased knowledge of use of DME;Decreased range of motion;Pain       PT Treatment Interventions Stair training;Gait training;Balance training;DME instruction;Therapeutic exercise;Functional mobility training;Therapeutic activities;Patient/family education    PT Goals (Current goals can be found in the Care Plan section)  Acute Rehab PT Goals PT Goal Formulation: With patient Time For Goal Achievement: 02/21/22 Potential to Achieve Goals: Good    Frequency 7X/week     Co-evaluation               AM-PAC PT "6 Clicks" Mobility  Outcome Measure Help needed turning from your back to your side while in a flat bed without using bedrails?: A Little Help needed moving from lying on your back to sitting on the side of a flat bed without using bedrails?: A Little Help needed moving to and from a bed to a chair (including a wheelchair)?: A Lot Help needed standing up from a chair using your arms (e.g., wheelchair or bedside chair)?: A Lot Help needed to walk in hospital room?: A Lot Help needed climbing 3-5 steps with a railing? : Total 6 Click Score: 13    End of Session Equipment Utilized During Treatment: Gait belt Activity Tolerance: Patient tolerated treatment well Patient left: in bed;with call bell/phone within reach;with chair alarm set Nurse Communication: Mobility status PT Visit Diagnosis: Other abnormalities of gait and mobility (R26.89)    Time: 6659-9357 PT Time Calculation (min) (ACUTE ONLY): 14 min   Charges:   PT Evaluation $PT Eval Low Complexity: 1 Low         Kati PT, DPT Acute Rehabilitation Services Pager: [phone] Office:  [phone]   Janan Halter [place] 02/18/2022, 11:19 AM

## 2022-02-18 NOTE — Progress Notes (Signed)
Orthopedic Tech Progress Note Patient Details:  Brittany Todd [birth year] [id number]  Patient ID: Brittany Todd, female   DOB: February 10, 1961, 61 y.o.   MRN: [id number]  Brittany Todd 02/18/2022, 10:36 AM Right knee immobilizer applied

## 2022-02-18 NOTE — Progress Notes (Signed)
Physical Therapy Treatment Patient Details Name: Brittany Todd MRN: [id number] DOB: 1960/11/16 Today's Date: 02/18/2022   History of Present Illness Pt is a 61 year old female s/p right TKA on 02/17/22 with PMHx significant for but not limited to Hashimotos, Factor V Leiden, HTN, migraines, arthritis.    PT Comments    Pt educated on KI use and assisted to [facility].  Pt with improved stability wearing KI and able to ambulate 20'x2.  Ambulation limited by fatigue and pain.  Pt assisted back to bed and educated on performing ankle pumps, knee extension and knee flexion once pain improved.  Pt aware to perform 10 repetitions of each to start demonstrated each exercise correctly on nonsurgical leg.  Pt anticipates d/c home tomorrow.    Recommendations for follow up therapy are one component of a multi-disciplinary discharge planning process, led by the attending physician.  Recommendations may be updated based on patient status, additional functional criteria and insurance authorization.  Follow Up Recommendations  Follow physician's recommendations for discharge plan and follow up therapies     Assistance Recommended at Discharge Frequent or constant Supervision/Assistance  Patient can return home with the following A little help with walking and/or transfers;A little help with bathing/dressing/bathroom;Help with stairs or ramp for entrance   Equipment Recommendations  None recommended by PT    Recommendations for Other Services       Precautions / Restrictions Precautions Precautions: Fall;Knee Required Braces or Orthoses: Knee Immobilizer - Right Restrictions Other Position/Activity Restrictions: WBAT     Mobility  Bed Mobility Overal bed mobility: Needs Assistance Bed Mobility: Supine to Sit, Sit to Supine     Supine to sit: Supervision Sit to supine: Supervision   General bed mobility comments: cues for self assist    Transfers Overall transfer level: Needs  assistance Equipment used: Rolling walker (2 wheels) Transfers: Sit to/from Stand Sit to Stand: Min assist Stand pivot transfers: Min assist         General transfer comment: verbal cues for safety and technique, pt requesting to use bathroom again so provided BSC, pt with improved LE stability with KI in place    Ambulation/Gait Ambulation/Gait assistance: Min guard, Min assist Gait Distance (Feet): 20 Feet (x2) Assistive device: Rolling walker (2 wheels) Gait Pattern/deviations: Step-to pattern, Antalgic, Decreased stance time - right       General Gait Details: verbal cues for sequence, RW positioning, step length; distance to tolerance and recliner following for safety, pt required one seated rest break due to pain and fatigue   Stairs             Wheelchair Mobility    Modified Rankin (Stroke Patients Only)       Balance                                            Cognition Arousal/Alertness: Awake/alert Behavior During Therapy: WFL for tasks assessed/performed Overall Cognitive Status: Within Functional Limits for tasks assessed                                          Exercises      General Comments        Pertinent Vitals/Pain Pain Assessment Pain Assessment: 0-10 Pain Score: 6  Pain Location: right knee  Pain Descriptors / Indicators: Sore, Aching Pain Intervention(s): Monitored during session, Repositioned    Home Living                          Prior Function            PT Goals (current goals can now be found in the care plan section) Acute Rehab PT Goals PT Goal Formulation: With patient Time For Goal Achievement: 02/21/22 Potential to Achieve Goals: Good Progress towards PT goals: Progressing toward goals    Frequency    7X/week      PT Plan Current plan remains appropriate    Co-evaluation              AM-PAC PT "6 Clicks" Mobility   Outcome Measure  Help needed  turning from your back to your side while in a flat bed without using bedrails?: A Little Help needed moving from lying on your back to sitting on the side of a flat bed without using bedrails?: A Little Help needed moving to and from a bed to a chair (including a wheelchair)?: A Little Help needed standing up from a chair using your arms (e.g., wheelchair or bedside chair)?: A Little Help needed to walk in hospital room?: A Lot Help needed climbing 3-5 steps with a railing? : A Lot 6 Click Score: 16    End of Session Equipment Utilized During Treatment: Gait belt;Right knee immobilizer Activity Tolerance: Patient tolerated treatment well Patient left: in bed;with call bell/phone within reach;with bed alarm set Nurse Communication: Mobility status PT Visit Diagnosis: Other abnormalities of gait and mobility (R26.89)     Time: 1478-2956 PT Time Calculation (min) (ACUTE ONLY): 25 min  Charges:  $Gait Training: 8-22 mins $Therapeutic Activity: 8-22 mins                     Thomasene Mohair PT, DPT Acute Rehabilitation Services Pager: [phone] Office: [phone]    Brittany Todd 02/18/2022, 3:08 PM

## 2022-02-18 NOTE — TOC Transition Note (Signed)
Transition of Care [facility]) - CM/SW Discharge Note  Patient Details  Name: Brittany Todd MRN: [id number] Date of Birth: Jan 30, 1961  Transition of Care [facility] CM/SW Contact:  Sherie Don, LCSW Phone Number: 02/18/2022, 10:23 AM  Clinical Narrative: Patient is expected to discharge home after working with PT. CSW met with patient to confirm discharge plan and needs. Patient will go home with OPPT at [facility] in [place]. Patient will need a youth rolling walker, which was delivered to patient's room by [employer]. TOC signing off.  Final next level of care: OP Rehab Barriers to Discharge: No Barriers Identified  Patient Goals and CMS Choice Patient states their goals for this hospitalization and ongoing recovery are:: Discharge home with OPPT at [facility] CMS Medicare.gov Compare Post Acute Care list provided to:: Patient Choice offered to / list presented to : Patient  Discharge Plan and Services          DME Arranged: [facility] Rile youth DME Agency: [employer] Representative spoke with at DME Agency: Prearranged in orthopedist's office  Readmission Risk Interventions     No data to display

## 2022-02-19 DIAGNOSIS — M1711 Unilateral primary osteoarthritis, right knee: Secondary | ICD-10-CM | POA: Diagnosis not present

## 2022-02-19 LAB — CBC
HCT: 39.2 % (ref 36.0–46.0)
Hemoglobin: 12.9 g/dL (ref 12.0–15.0)
MCH: 30.2 pg (ref 26.0–34.0)
MCHC: 32.9 g/dL (ref 30.0–36.0)
MCV: 91.8 fL (ref 80.0–100.0)
Platelets: 212 10*3/uL (ref 150–400)
RBC: 4.27 MIL/uL (ref 3.87–5.11)
RDW: 12.3 % (ref 11.5–15.5)
WBC: 15.6 10*3/uL — ABNORMAL HIGH (ref 4.0–10.5)
nRBC: 0 % (ref 0.0–0.2)

## 2022-02-19 NOTE — Progress Notes (Signed)
Physical Therapy Treatment Patient Details Name: Brittany Todd MRN: [id number] DOB: 01-Jul-1961 Today's Date: 02/19/2022   History of Present Illness Pt is a 61 year old female s/p right TKA on 02/17/22 with PMHx significant for but not limited to Hashimotos, Factor V Leiden, HTN, migraines, arthritis.    PT Comments    Pt assisted with ambulating in hallway and limited by significant pain.  RN into room end of session with pain meds.  Will return to assist with ambulating and practicing steps prior to d/c.  Recommendations for follow up therapy are one component of a multi-disciplinary discharge planning process, led by the attending physician.  Recommendations may be updated based on patient status, additional functional criteria and insurance authorization.  Follow Up Recommendations  Follow physician's recommendations for discharge plan and follow up therapies     Assistance Recommended at Discharge    Patient can return home with the following A little help with walking and/or transfers;A little help with bathing/dressing/bathroom;Help with stairs or ramp for entrance   Equipment Recommendations  None recommended by PT    Recommendations for Other Services       Precautions / Restrictions Precautions Precautions: Fall;Knee Required Braces or Orthoses: Knee Immobilizer - Right Restrictions Other Position/Activity Restrictions: WBAT     Mobility  Bed Mobility               General bed mobility comments: pt in recliner; pt assisted with donning KI in recliner prior to mobility    Transfers Overall transfer level: Needs assistance Equipment used: Rolling walker (2 wheels) Transfers: Sit to/from Stand Sit to Stand: Min guard           General transfer comment: verbal cues for safety and technique    Ambulation/Gait Ambulation/Gait assistance: Min guard Gait Distance (Feet): 30 Feet Assistive device: Rolling walker (2 wheels) Gait Pattern/deviations:  Step-to pattern, Antalgic, Decreased stance time - right       General Gait Details: verbal cues for sequence, RW positioning, step length; distance to tolerance and limited by pain   Stairs             Wheelchair Mobility    Modified Rankin (Stroke Patients Only)       Balance                                            Cognition Arousal/Alertness: Awake/alert Behavior During Therapy: WFL for tasks assessed/performed Overall Cognitive Status: Within Functional Limits for tasks assessed                                          Exercises      General Comments        Pertinent Vitals/Pain Pain Assessment Pain Assessment: 0-10 Pain Score: 9  Pain Location: right knee Pain Descriptors / Indicators: Sore, Aching Pain Intervention(s): Monitored during session, Repositioned, RN gave pain meds during session, Ice applied    Home Living                          Prior Function            PT Goals (current goals can now be found in the care plan section) Progress towards PT goals: Progressing toward  goals    Frequency    7X/week      PT Plan Current plan remains appropriate    Co-evaluation              AM-PAC PT "6 Clicks" Mobility   Outcome Measure  Help needed turning from your back to your side while in a flat bed without using bedrails?: A Little Help needed moving from lying on your back to sitting on the side of a flat bed without using bedrails?: A Little Help needed moving to and from a bed to a chair (including a wheelchair)?: A Little Help needed standing up from a chair using your arms (e.g., wheelchair or bedside chair)?: A Little Help needed to walk in hospital room?: A Little Help needed climbing 3-5 steps with a railing? : A Lot 6 Click Score: 17    End of Session Equipment Utilized During Treatment: Gait belt;Right knee immobilizer Activity Tolerance: Patient tolerated treatment  well Patient left: with call bell/phone within reach;in chair;with nursing/sitter in room Nurse Communication: Mobility status PT Visit Diagnosis: Other abnormalities of gait and mobility (R26.89)     Time: 1610-9604 PT Time Calculation (min) (ACUTE ONLY): 12 min  Charges:  $Gait Training: 8-22 mins                     Paulino Door, DPT Acute Rehabilitation Services Pager: [phone] Office: [phone]    Brittany Todd 02/19/2022, 12:28 PM

## 2022-02-19 NOTE — Progress Notes (Signed)
Physical Therapy Treatment Patient Details Name: Brittany Todd MRN: [id number] DOB: 09-24-1960 Today's Date: 02/19/2022   History of Present Illness Pt is a 61 year old female s/p right TKA on 02/17/22 with PMHx significant for but not limited to Hashimotos, Factor V Leiden, HTN, migraines, arthritis.    PT Comments    Pt ambulated in hallway, practiced step and performed LE exercises.  Pt educated on knee positioning during healing and KI use.  Pt provided with HEP handout.  Pt had no further questions and feels ready for d/c home today.    Recommendations for follow up therapy are one component of a multi-disciplinary discharge planning process, led by the attending physician.  Recommendations may be updated based on patient status, additional functional criteria and insurance authorization.  Follow Up Recommendations  Follow physician's recommendations for discharge plan and follow up therapies     Assistance Recommended at Discharge    Patient can return home with the following A little help with walking and/or transfers;A little help with bathing/dressing/bathroom;Help with stairs or ramp for entrance   Equipment Recommendations  None recommended by PT    Recommendations for Other Services       Precautions / Restrictions Precautions Precautions: Fall;Knee Required Braces or Orthoses: Knee Immobilizer - Right Restrictions Other Position/Activity Restrictions: WBAT     Mobility  Bed Mobility Overal bed mobility: Needs Assistance Bed Mobility: Supine to Sit, Sit to Supine     Supine to sit: Supervision Sit to supine: Supervision   General bed mobility comments: pt in recliner; pt assisted with donning KI in recliner prior to mobility    Transfers Overall transfer level: Needs assistance Equipment used: Rolling walker (2 wheels) Transfers: Sit to/from Stand Sit to Stand: Min guard           General transfer comment: verbal cues for safety and technique     Ambulation/Gait Ambulation/Gait assistance: Min guard Gait Distance (Feet): 80 Feet Assistive device: Rolling walker (2 wheels) Gait Pattern/deviations: Step-to pattern, Antalgic, Decreased stance time - right       General Gait Details: verbal cues for sequence, RW positioning, step length; distance to tolerance   Stairs Stairs: Yes Stairs assistance: Min guard Stair Management: Step to pattern, Forwards, With walker Number of Stairs: 1 General stair comments: verbal cues for sequence and safety ; pt reports understanding and did not feel she needed to practice second time   Wheelchair Mobility    Modified Rankin (Stroke Patients Only)       Balance                                            Cognition Arousal/Alertness: Awake/alert Behavior During Therapy: WFL for tasks assessed/performed Overall Cognitive Status: Within Functional Limits for tasks assessed                                          Exercises Total Joint Exercises Ankle Circles/Pumps: AROM, Both, 10 reps Quad Sets: AROM, Right, 10 reps Short Arc Quad: AAROM, Right, 10 reps Heel Slides: AAROM, Right, 10 reps Hip ABduction/ADduction: AAROM, Right, 10 reps Straight Leg Raises: AAROM, Right, 10 reps    General Comments        Pertinent Vitals/Pain Pain Assessment Pain Assessment: 0-10 Pain Score: 6  Pain Location: right knee Pain Descriptors / Indicators: Sore, Aching Pain Intervention(s): Monitored during session, Repositioned    Home Living                          Prior Function            PT Goals (current goals can now be found in the care plan section) Progress towards PT goals: Progressing toward goals    Frequency    7X/week      PT Plan Current plan remains appropriate    Co-evaluation              AM-PAC PT "6 Clicks" Mobility   Outcome Measure  Help needed turning from your back to your side while in a flat  bed without using bedrails?: A Little Help needed moving from lying on your back to sitting on the side of a flat bed without using bedrails?: A Little Help needed moving to and from a bed to a chair (including a wheelchair)?: A Little Help needed standing up from a chair using your arms (e.g., wheelchair or bedside chair)?: A Little Help needed to walk in hospital room?: A Little Help needed climbing 3-5 steps with a railing? : A Little 6 Click Score: 18    End of Session Equipment Utilized During Treatment: Gait belt;Right knee immobilizer Activity Tolerance: Patient tolerated treatment well Patient left: in bed;with call bell/phone within reach Nurse Communication: Mobility status PT Visit Diagnosis: Other abnormalities of gait and mobility (R26.89)     Time: 5102-5852 PT Time Calculation (min) (ACUTE ONLY): 25 min  Charges:  $Gait Training: 8-22 mins $Therapeutic Exercise: 8-22 mins                    Thomasene Mohair PT, DPT Acute Rehabilitation Services Pager: [phone] Office: [phone]   Janan Halter [place] 02/19/2022, 3:56 PM

## 2022-02-19 NOTE — Progress Notes (Signed)
   Subjective: 2 Days Post-Op Procedure(s) (LRB): TOTAL KNEE ARTHROPLASTY (Right) Patient reports pain as mild.   Patient seen in rounds for Dr. Charlann Boxer. Patient is well, and has had no acute complaints or problems. No acute events overnight.  Patient ambulated 20+ feet with PT.  We will continue therapy today.   Objective: Vital signs in last 24 hours: Temp:  [97.3 F (36.3 C)-98.2 F (36.8 C)] 98.1 F (36.7 C) (06/22 0611) Pulse Rate:  [76-89] 76 (06/22 0611) Resp:  [17] 17 (06/22 0611) BP: (129-144)/(82-89) 130/89 (06/22 0611) SpO2:  [95 %-99 %] 99 % (06/22 0611)  Intake/Output from previous day:  Intake/Output Summary (Last 24 hours) at 02/19/2022 1042 Last data filed at 02/19/2022 0938 Gross per 24 hour  Intake 540 ml  Output --  Net 540 ml     Intake/Output this shift: Total I/O In: 420 [P.O.:420] Out: -   Labs: Recent Labs    02/18/22 0343 02/19/22 0340  HGB 13.9 12.9   Recent Labs    02/18/22 0343 02/19/22 0340  WBC 12.8* 15.6*  RBC 4.52 4.27  HCT 41.2 39.2  PLT 188 212   Recent Labs    02/18/22 0343  NA 140  K 4.7  CL 111  CO2 22  BUN 12  CREATININE 0.94  GLUCOSE 220*  CALCIUM 9.2   No results for input(s): "LABPT", "INR" in the last 72 hours.  Exam: General - Patient is Alert and Oriented Extremity - Neurologically intact Sensation intact distally Intact pulses distally Dorsiflexion/Plantar flexion intact Dressing - dressing C/D/I Motor Function - intact, moving foot and toes well on exam.   Past Medical History:  Diagnosis Date   ADD (attention deficit disorder)    Anxiety    Arthritis    Chest tightness    DOE (dyspnea on exertion)    Factor V Leiden (HCC)    GERD (gastroesophageal reflux disease)    Hashimoto's disease    Hyperlipemia    Hypertension    Hypothyroid    Migraines    PONV (postoperative nausea and vomiting)    Renal stone    TMJ (dislocation of temporomandibular joint)    Vitamin D deficiency      Assessment/Plan: 2 Days Post-Op Procedure(s) (LRB): TOTAL KNEE ARTHROPLASTY (Right) Principal Problem:   S/P total knee arthroplasty, right  Estimated body mass index is 37.37 kg/m as calculated from the following:   Height as of this encounter: 4\' 11"  (1.499 m).   Weight as of this encounter: 83.9 kg. Advance diet Up with therapy D/C IV fluids    DVT Prophylaxis - Xarelto Weight bearing as tolerated.  Hgb stable at 12.9 this AM.  Knee immobilizer only when up for buckling.  Plan is to go Home after hospital stay. Plan for discharge today following 1-2 sessions of PT as long as they are meeting their goals. Patient is scheduled for OPPT. Follow up in the office in 2 weeks.   , PA-C Orthopedic Surgery [phone] 02/19/2022, 10:42 AM

## 2022-02-25 NOTE — Telephone Encounter (Signed)
Pt had a video visit recently with [facility] and all was discussed during that visit.

## 2022-03-04 NOTE — Discharge Summary (Signed)
Patient ID: Brittany Todd MRN: [id number] DOB/AGE: 63-Mar-1962 63 y.o.  Admit date: 02/17/2022 Discharge date: 02/19/2022  Admission Diagnoses:  Right knee osteoarthritis  Discharge Diagnoses:  Principal Problem:   S/P total knee arthroplasty, right   Past Medical History:  Diagnosis Date   ADD (attention deficit disorder)    Anxiety    Arthritis    Chest tightness    DOE (dyspnea on exertion)    Factor V Leiden (HCC)    GERD (gastroesophageal reflux disease)    Hashimoto's disease    Hyperlipemia    Hypertension    Hypothyroid    Migraines    PONV (postoperative nausea and vomiting)    Renal stone    TMJ (dislocation of temporomandibular joint)    Vitamin D deficiency     Surgeries: Procedure(s): TOTAL KNEE ARTHROPLASTY on 02/17/2022   Consultants:   Discharged Condition: Improved  Hospital Course: Brittany Todd is an 61 y.o. female who was admitted 02/17/2022 for operative treatment ofS/P total knee arthroplasty, right. Patient has severe unremitting pain that affects sleep, daily activities, and work/hobbies. After pre-op clearance the patient was taken to the operating room on 02/17/2022 and underwent  Procedure(s): TOTAL KNEE ARTHROPLASTY.    Patient was given perioperative antibiotics:  Anti-infectives (From admission, onward)    Start     Dose/Rate Route Frequency Ordered Stop   02/17/22 2200  ceFAZolin (ANCEF) IVPB 2g/100 mL premix        2 g 200 mL/hr over 30 Minutes Intravenous Every 6 hours 02/17/22 1849 02/18/22 0417   02/17/22 1849  acyclovir (ZOVIRAX) tablet 400 mg  Status:  Discontinued        400 mg Oral 3 times daily PRN 02/17/22 1849 02/19/22 2027   02/17/22 1345  ceFAZolin (ANCEF) IVPB 2g/100 mL premix        2 g 200 mL/hr over 30 Minutes Intravenous On call to O.R. 02/17/22 1329 02/17/22 1628        Patient was given sequential compression devices, early ambulation, and chemoprophylaxis to prevent DVT. Patient worked with PT and was  meeting their goals regarding safe ambulation and transfers.  Patient benefited maximally from hospital stay and there were no complications.    Recent vital signs: No data found.   Recent laboratory studies: No results for input(s): "WBC", "HGB", "HCT", "PLT", "NA", "K", "CL", "CO2", "BUN", "CREATININE", "GLUCOSE", "INR", "CALCIUM" in the last 72 hours.  Invalid input(s): "PT", "2"   Discharge Medications:   Allergies as of 02/19/2022       Reactions   Ozempic (0.25 Or 0.5 Mg-dose) [semaglutide(0.25 Or 0.5mg -dos)] Diarrhea, Nausea And Vomiting   Codeine Other (See Comments)   hallucinations   Cymbalta [duloxetine Hcl] Other (See Comments)   lethargic   Morphine And Related Other (See Comments)   Hallucinations         Medication List     STOP taking these medications    aspirin EC 81 MG tablet   baclofen 10 MG tablet Commonly known as: LIORESAL   Fish Oil 1200 MG Caps       TAKE these medications    acetaminophen 325 MG tablet Commonly known as: TYLENOL Take 3 tablets (975 mg total) by mouth every 6 (six) hours.   acyclovir 400 MG tablet Commonly known as: ZOVIRAX Take 400 mg by mouth 3 (three) times daily as needed (outbreaks).   amphetamine-dextroamphetamine 10 MG tablet Commonly known as: ADDERALL Take 10 mg by mouth 2 (two) times daily as  needed (ADHD).   CALCIUM PO Take 1 tablet by mouth daily.   cholecalciferol 25 MCG (1000 UNIT) tablet Commonly known as: VITAMIN D Take 1,000 Units by mouth daily.   cyanocobalamin 1000 MCG tablet Take 1,000 mcg by mouth daily.   docusate sodium 100 MG capsule Commonly known as: COLACE Take 1 capsule (100 mg total) by mouth 2 (two) times daily.   HYDROmorphone 2 MG tablet Commonly known as: DILAUDID Take 1 tablet (2 mg total) by mouth every 4 (four) hours as needed for severe pain.   levothyroxine 88 MCG tablet Commonly known as: SYNTHROID Take 88 mcg by mouth daily before breakfast.   losartan 50 MG  tablet Commonly known as: COZAAR Take 50 mg by mouth daily.   MAGNESIUM PO Take 1 tablet by mouth daily.   meclizine 25 MG tablet Commonly known as: ANTIVERT Take 12.5-25 mg by mouth 3 (three) times daily as needed for nausea or dizziness.   methocarbamol 500 MG tablet Commonly known as: ROBAXIN Take 1 tablet (500 mg total) by mouth every 6 (six) hours as needed for muscle spasms.   omeprazole 20 MG tablet Commonly known as: PRILOSEC OTC Take 20 mg by mouth daily as needed.   ondansetron 4 MG disintegrating tablet Commonly known as: ZOFRAN-ODT Take 1 tablet (4 mg total) by mouth every 8 (eight) hours as needed. What changed: reasons to take this   polyethylene glycol 17 g packet Commonly known as: MIRALAX / GLYCOLAX Take 17 g by mouth daily as needed for mild constipation.   rivaroxaban 10 MG Tabs tablet Commonly known as: XARELTO Take 1 tablet (10 mg total) by mouth daily with breakfast for 14 days. Then take aspirin 81 mg twice daily for another month.   rosuvastatin 40 MG tablet Commonly known as: CRESTOR rosuvastatin 40 mg tablet  TAKE 1 TABLET BY MOUTH ONCE DAILY   SUMAtriptan 100 MG tablet Commonly known as: IMITREX Take 1 tablet (100 mg total) by mouth once as needed for up to 1 dose. May repeat in 2 hours if headache persists or recurs.   zinc gluconate 50 MG tablet Take 50 mg by mouth daily.   zonisamide 100 MG capsule Commonly known as: ZONEGRAN Take 100 mg by mouth daily.               Discharge Care Instructions  (From admission, onward)           Start     Ordered   02/19/22 0000  Change dressing       Comments: Maintain surgical dressing until follow up in the clinic. If the edges start to pull up, may reinforce with tape. If the dressing is no longer working, may remove and cover with gauze and tape, but must keep the area dry and clean.  Call with any questions or concerns.   02/19/22 1045   02/18/22 0000  Change dressing        Comments: Maintain surgical dressing until follow up in the clinic. If the edges start to pull up, may reinforce with tape. If the dressing is no longer working, may remove and cover with gauze and tape, but must keep the area dry and clean.  Call with any questions or concerns.   02/18/22 0829            Diagnostic Studies: SLEEP STUDY DOCUMENTS  Result Date: 02/04/2022 Ordered by an unspecified provider.  Cpap titration  Result Date: 02/03/2022 Oretha Milch, MD  02/12/2022  3:48 PM Patient Name: Claudeen, Armbrecht Date: 02/03/2022 Gender: Female D.O.B: Jan 15, 1961 Age (years): 71 Referring Provider: Kara Mead MD, ABSM Height (inches): 59 Interpreting Physician: Kara Mead MD, ABSM Weight (lbs): 185 RPSGT: Carolin Coy BMI: 37 MRN: [id number] Neck Size: 15.00 <br> <br> CLINICAL INFORMATION The patient is referred for a BiPAP titration to treat sleep apnea. Date of NPSG: 12/2021 showed severe OSA with AHI 62/h & lowest desat of 55 % SLEEP STUDY TECHNIQUE As per the AASM Manual for the Scoring of Sleep and Associated Events v2.3 (April 2016) with a hypopnea requiring 4% desaturations. The channels recorded and monitored were frontal, central and occipital EEG, electrooculogram (EOG), submentalis EMG (chin), nasal and oral airflow, thoracic and abdominal wall motion, anterior tibialis EMG, snore microphone, electrocardiogram, and pulse oximetry. Bilevel positive airway pressure (BPAP) was initiated at the beginning of the study and titrated to treat sleep-disordered breathing. MEDICATIONS Medications self-administered by patient taken the night of the study : ZONISAMIDE, Rosuvastatin RESPIRATORY PARAMETERS Optimal IPAP Pressure (cm): 21 AHI at Optimal Pressure (/hr) 55.9 Optimal EPAP Pressure (cm): 17 Overall Minimal O2 (%): 63.0 Minimal O2 at Optimal Pressure (%): 87.0 SLEEP ARCHITECTURE Start Time: 10:38:35 PM Stop Time: 4:56:15 AM Total Time (min): 377.7 Total Sleep Time (min): 113 Sleep  Latency (min): 8.3 Sleep Efficiency (%): 29.9% REM Latency (min): 9.0 WASO (min): 256.3 Stage N1 (%): 32.3% Stage N2 (%): 52.7% Stage N3 (%): 1.3% Stage R (%): 13.7 Supine (%): 97.35 Arousal Index (/hr): 35.0 CARDIAC DATA The 2 lead EKG demonstrated sinus rhythm. The mean heart rate was 69.9 beats per minute. Other EKG findings include: PVCs. LEG MOVEMENT DATA The total Periodic Limb Movements of Sleep (PLMS) were 0. The PLMS index was 0.0. A PLMS index of <15 is considered normal in adults. IMPRESSIONS - An optimal PAP pressure was selected for this patient ( 21 / 17cm of water) . She did not tolerate CPAP due to high pressures - Moderate Central Sleep Apnea was noted during this titration (CAI = 27.1/h). - Severe oxygen desaturations were observed during this titration (min O2 = 63.0%). - No snoring was audible during this study. - 2-lead EKG demonstrated: PVCs - Clinically significant periodic limb movements were not noted during this study. Arousals associated with PLMs were rare. - Total sleep time was only 115 mins but 2-3 periods of REM sleep was noted. - Multiple masks were tied during this titration study DIAGNOSIS - Obstructive Sleep Apnea (G47.33) RECOMMENDATIONS - Trial of BiPAP therapy on 21/17 cm H2O with a Small size Resmed Full Face AirFit F20 mask and heated humidification. Alternatively autoBiPAP can be tried - Avoid alcohol, sedatives and other CNS depressants that may worsen sleep apnea and disrupt normal sleep architecture. - Sleep hygiene should be reviewed to assess factors that may improve sleep quality. - Weight management and regular exercise should be initiated or continued. - Return to Sleep Center for re-evaluation after 4 weeks of therapy Kara Mead MD Board Certified in Sleep medicine    Disposition: Discharge disposition: 01-Home or Self Care       Discharge Instructions     Call MD / Call 911   Complete by: As directed    If you experience chest pain or shortness of  breath, CALL 911 and be transported to the hospital emergency room.  If you develope a fever above 101 F, pus (white drainage) or increased drainage or redness at the wound, or calf pain, call your surgeon's office.  Call MD / Call 911   Complete by: As directed    If you experience chest pain or shortness of breath, CALL 911 and be transported to the hospital emergency room.  If you develope a fever above 101 F, pus (white drainage) or increased drainage or redness at the wound, or calf pain, call your surgeon's office.   Change dressing   Complete by: As directed    Maintain surgical dressing until follow up in the clinic. If the edges start to pull up, may reinforce with tape. If the dressing is no longer working, may remove and cover with gauze and tape, but must keep the area dry and clean.  Call with any questions or concerns.   Change dressing   Complete by: As directed    Maintain surgical dressing until follow up in the clinic. If the edges start to pull up, may reinforce with tape. If the dressing is no longer working, may remove and cover with gauze and tape, but must keep the area dry and clean.  Call with any questions or concerns.   Constipation Prevention   Complete by: As directed    Drink plenty of fluids.  Prune juice may be helpful.  You may use a stool softener, such as Colace (over the counter) 100 mg twice a day.  Use MiraLax (over the counter) for constipation as needed.   Constipation Prevention   Complete by: As directed    Drink plenty of fluids.  Prune juice may be helpful.  You may use a stool softener, such as Colace (over the counter) 100 mg twice a day.  Use MiraLax (over the counter) for constipation as needed.   Diet - low sodium heart healthy   Complete by: As directed    Diet - low sodium heart healthy   Complete by: As directed    Increase activity slowly as tolerated   Complete by: As directed    Weight bearing as tolerated with assist device (walker, cane,  etc) as directed, use it as long as suggested by your surgeon or therapist, typically at least 4-6 weeks.   Increase activity slowly as tolerated   Complete by: As directed    Weight bearing as tolerated with assist device (walker, cane, etc) as directed, use it as long as suggested by your surgeon or therapist, typically at least 4-6 weeks.   Post-operative opioid taper instructions:   Complete by: As directed    POST-OPERATIVE OPIOID TAPER INSTRUCTIONS: It is important to wean off of your opioid medication as soon as possible. If you do not need pain medication after your surgery it is ok to stop day one. Opioids include: Codeine, Hydrocodone(Norco, Vicodin), Oxycodone(Percocet, oxycontin) and hydromorphone amongst others.  Long term and even short term use of opiods can cause: Increased pain response Dependence Constipation Depression Respiratory depression And more.  Withdrawal symptoms can include Flu like symptoms Nausea, vomiting And more Techniques to manage these symptoms Hydrate well Eat regular healthy meals Stay active Use relaxation techniques(deep breathing, meditating, yoga) Do Not substitute Alcohol to help with tapering If you have been on opioids for less than two weeks and do not have pain than it is ok to stop all together.  Plan to wean off of opioids This plan should start within one week post op of your joint replacement. Maintain the same interval or time between taking each dose and first decrease the dose.  Cut the total daily intake of opioids by one tablet each  day Next start to increase the time between doses. The last dose that should be eliminated is the evening dose.      Post-operative opioid taper instructions:   Complete by: As directed    POST-OPERATIVE OPIOID TAPER INSTRUCTIONS: It is important to wean off of your opioid medication as soon as possible. If you do not need pain medication after your surgery it is ok to stop day one. Opioids  include: Codeine, Hydrocodone(Norco, Vicodin), Oxycodone(Percocet, oxycontin) and hydromorphone amongst others.  Long term and even short term use of opiods can cause: Increased pain response Dependence Constipation Depression Respiratory depression And more.  Withdrawal symptoms can include Flu like symptoms Nausea, vomiting And more Techniques to manage these symptoms Hydrate well Eat regular healthy meals Stay active Use relaxation techniques(deep breathing, meditating, yoga) Do Not substitute Alcohol to help with tapering If you have been on opioids for less than two weeks and do not have pain than it is ok to stop all together.  Plan to wean off of opioids This plan should start within one week post op of your joint replacement. Maintain the same interval or time between taking each dose and first decrease the dose.  Cut the total daily intake of opioids by one tablet each day Next start to increase the time between doses. The last dose that should be eliminated is the evening dose.      TED hose   Complete by: As directed    Use stockings (TED hose) for 2 weeks on both leg(s).  You may remove them at night for sleeping.   TED hose   Complete by: As directed    Use stockings (TED hose) for 2 weeks on both leg(s).  You may remove them at night for sleeping.        Follow-up Information     Paralee Cancel, MD. Schedule an appointment as soon as possible for a visit in 2 week(s).   Specialty: Orthopedic Surgery Contact information: [address] [place] [address] [phone]                  Signed: Irving Copas 03/04/2022, 9:18 AM

## 2022-05-13 NOTE — Progress Notes (Signed)
Sent message, via epic in basket, requesting orders in epic from surgeon.  

## 2022-05-19 NOTE — Patient Instructions (Signed)
DUE TO COVID-19 ONLY TWO VISITORS  (aged 63 and older)  ARE ALLOWED TO COME WITH YOU AND STAY IN THE WAITING ROOM ONLY DURING PRE OP AND PROCEDURE.   **NO VISITORS ARE ALLOWED IN THE SHORT STAY AREA OR RECOVERY ROOM!!**  IF YOU WILL BE ADMITTED INTO THE HOSPITAL YOU ARE ALLOWED ONLY FOUR SUPPORT PEOPLE DURING VISITATION HOURS ONLY (7 AM -8PM)   The support person(s) must pass our screening, gel in and out, and wear a mask at all times, including in the patient's room. Patients must also wear a mask when staff or their support person are in the room. Visitors GUEST BADGE MUST BE WORN VISIBLY  One adult visitor may remain with you overnight and MUST be in the room by 8 P.M.     Your procedure is scheduled on: 06/02/22   Report to [facility] Main Entrance    Report to admitting at : 10:20 AM   Call this number if you have problems the morning of surgery [phone]   Do not eat food :After Midnight.   After Midnight you may have the following liquids until : 9:45 AM DAY OF SURGERY  Water Black Coffee (sugar ok, NO MILK/CREAM OR CREAMERS)  Tea (sugar ok, NO MILK/CREAM OR CREAMERS) regular and decaf                             Plain Jell-O (NO RED)                                           Fruit ices (not with fruit pulp, NO RED)                                     Popsicles (NO RED)                                                                  Juice: apple, WHITE grape, WHITE cranberry Sports drinks like Gatorade (NO RED)              Drink  Ensure drink AT : 9:45 AM the day of surgery.    The day of surgery:  Drink ONE (1) Pre-Surgery Clear Ensure or G2 at AM the morning of surgery. Drink in one sitting. Do not sip.  This drink was given to you during your hospital  pre-op appointment visit. Nothing else to drink after completing the  Pre-Surgery Clear Ensure or G2.          If you have questions, please contact your surgeon's office.    Oral Hygiene is also  important to reduce your risk of infection.                                    Remember - BRUSH YOUR TEETH THE MORNING OF SURGERY WITH YOUR REGULAR TOOTHPASTE   Do NOT smoke after Midnight   Take these medicines the morning of surgery with A  SIP OF WATER: zonisamide,synthroid,omeprazole.Tylenol as needed.  DO NOT TAKE ANY ORAL DIABETIC MEDICATIONS DAY OF YOUR SURGERY  Bring CPAP mask and tubing day of surgery.                              You may not have any metal on your body including hair pins, jewelry, and body piercing             Do not wear make-up, lotions, powders, perfumes/cologne, or deodorant  Do not wear nail polish including gel and S&S, artificial/acrylic nails, or any other type of covering on natural nails including finger and toenails. If you have artificial nails, gel coating, etc. that needs to be removed by a nail salon please have this removed prior to surgery or surgery may need to be canceled/ delayed if the surgeon/ anesthesia feels like they are unable to be safely monitored.   Do not shave  48 hours prior to surgery.   Do not bring valuables to the hospital. [facility] IS NOT             RESPONSIBLE   FOR VALUABLES.   Contacts, dentures or bridgework may not be worn into surgery.   Bring small overnight bag day of surgery.   DO NOT BRING YOUR HOME MEDICATIONS TO THE HOSPITAL. PHARMACY WILL DISPENSE MEDICATIONS LISTED ON YOUR MEDICATION LIST TO YOU DURING YOUR ADMISSION IN THE HOSPITAL!    Patients discharged on the day of surgery will not be allowed to drive home.  Someone NEEDS to stay with you for the first 24 hours after anesthesia.   Special Instructions: Bring a copy of your healthcare power of attorney and living will documents         the day of surgery if you haven't scanned them before.              Please read over the following fact sheets you were given: IF YOU HAVE QUESTIONS ABOUT YOUR PRE-OP INSTRUCTIONS PLEASE CALL [phone]     [facility]  [facility] - Preparing for Surgery Before surgery, you can play an important role.  Because skin is not sterile, your skin needs to be as free of germs as possible.  You can reduce the number of germs on your skin by washing with CHG (chlorahexidine gluconate) soap before surgery.  CHG is an antiseptic cleaner which kills germs and bonds with the skin to continue killing germs even after washing. Please DO NOT use if you have an allergy to CHG or antibacterial soaps.  If your skin becomes reddened/irritated stop using the CHG and inform your nurse when you arrive at Short Stay. Do not shave (including legs and underarms) for at least 48 hours prior to the first CHG shower.  You may shave your face/neck. Please follow these instructions carefully:  1.  Shower with CHG Soap the night before surgery and the  morning of Surgery.  2.  If you choose to wash your hair, wash your hair first as usual with your  normal  shampoo.  3.  After you shampoo, rinse your hair and body thoroughly to remove the  shampoo.                           4.  Use CHG as you would any other liquid soap.  You can apply chg directly  to the skin and wash  Gently with a scrungie or clean washcloth.  5.  Apply the CHG Soap to your body ONLY FROM THE NECK DOWN.   Do not use on face/ open                           Wound or open sores. Avoid contact with eyes, ears mouth and genitals (private parts).                       Wash face,  Genitals (private parts) with your normal soap.             6.  Wash thoroughly, paying special attention to the area where your surgery  will be performed.  7.  Thoroughly rinse your body with warm water from the neck down.  8.  DO NOT shower/wash with your normal soap after using and rinsing off  the CHG Soap.                9.  Pat yourself dry with a clean towel.            10.  Wear clean pajamas.            11.  Place clean sheets on your bed the night of your first shower and do not   sleep with pets. Day of Surgery : Do not apply any lotions/deodorants the morning of surgery.  Please wear clean clothes to the hospital/surgery center.  FAILURE TO FOLLOW THESE INSTRUCTIONS MAY RESULT IN THE CANCELLATION OF YOUR SURGERY PATIENT SIGNATURE_________________________________  NURSE SIGNATURE__________________________________  ________________________________________________________________________   Adam Phenix  An incentive spirometer is a tool that can help keep your lungs clear and active. This tool measures how well you are filling your lungs with each breath. Taking long deep breaths may help reverse or decrease the chance of developing breathing (pulmonary) problems (especially infection) following: A long period of time when you are unable to move or be active. BEFORE THE PROCEDURE  If the spirometer includes an indicator to show your best effort, your nurse or respiratory therapist will set it to a desired goal. If possible, sit up straight or lean slightly forward. Try not to slouch. Hold the incentive spirometer in an upright position. INSTRUCTIONS FOR USE  Sit on the edge of your bed if possible, or sit up as far as you can in bed or on a chair. Hold the incentive spirometer in an upright position. Breathe out normally. Place the mouthpiece in your mouth and seal your lips tightly around it. Breathe in slowly and as deeply as possible, raising the piston or the ball toward the top of the column. Hold your breath for 3-5 seconds or for as long as possible. Allow the piston or ball to fall to the bottom of the column. Remove the mouthpiece from your mouth and breathe out normally. Rest for a few seconds and repeat Steps 1 through 7 at least 10 times every 1-2 hours when you are awake. Take your time and take a few normal breaths between deep breaths. The spirometer may include an indicator to show your best effort. Use the indicator as a goal to work toward  during each repetition. After each set of 10 deep breaths, practice coughing to be sure your lungs are clear. If you have an incision (the cut made at the time of surgery), support your incision when coughing by placing a pillow or rolled up towels  firmly against it. Once you are able to get out of bed, walk around indoors and cough well. You may stop using the incentive spirometer when instructed by your caregiver.  RISKS AND COMPLICATIONS Take your time so you do not get dizzy or light-headed. If you are in pain, you may need to take or ask for pain medication before doing incentive spirometry. It is harder to take a deep breath if you are having pain. AFTER USE Rest and breathe slowly and easily. It can be helpful to keep track of a log of your progress. Your caregiver can provide you with a simple table to help with this. If you are using the spirometer at home, follow these instructions: [place] IF:  You are having difficultly using the spirometer. You have trouble using the spirometer as often as instructed. Your pain medication is not giving enough relief while using the spirometer. You develop fever of 100.5 F (38.1 C) or higher. SEEK IMMEDIATE MEDICAL CARE IF:  You cough up bloody sputum that had not been present before. You develop fever of 102 F (38.9 C) or greater. You develop worsening pain at or near the incision site. MAKE SURE YOU:  Understand these instructions. Will watch your condition. Will get help right away if you are not doing well or get worse. Document Released: 12/28/2006 Document Revised: 11/09/2011 Document Reviewed: 02/28/2007 [facility] Patient Information 2014 [place], [place].   ________________________________________________________________________

## 2022-05-20 ENCOUNTER — Encounter (HOSPITAL_COMMUNITY)
Admission: RE | Admit: 2022-05-20 | Discharge: 2022-05-20 | Disposition: A | Discharge status: Home / Self Care | Payer: BC Managed Care – PPO | Source: Ambulatory Visit | Attending: Orthopedic Surgery | Admitting: Orthopedic Surgery

## 2022-05-20 ENCOUNTER — Encounter (HOSPITAL_COMMUNITY): Payer: Self-pay

## 2022-05-20 ENCOUNTER — Other Ambulatory Visit: Payer: Self-pay

## 2022-05-20 VITALS — BP 129/92 | HR 85 | Temp 97.8°F | Ht 59.0 in | Wt 180.0 lb

## 2022-05-20 DIAGNOSIS — Z01812 Encounter for preprocedural laboratory examination: Secondary | ICD-10-CM | POA: Insufficient documentation

## 2022-05-20 DIAGNOSIS — Z01818 Encounter for other preprocedural examination: Secondary | ICD-10-CM

## 2022-05-20 DIAGNOSIS — M1712 Unilateral primary osteoarthritis, left knee: Secondary | ICD-10-CM | POA: Insufficient documentation

## 2022-05-20 DIAGNOSIS — R0789 Other chest pain: Secondary | ICD-10-CM | POA: Insufficient documentation

## 2022-05-20 HISTORY — DX: Sleep apnea, unspecified: G47.30

## 2022-05-20 HISTORY — DX: Personal history of urinary calculi: Z87.442

## 2022-05-20 LAB — BASIC METABOLIC PANEL
Anion gap: 4 — ABNORMAL LOW (ref 5–15)
BUN: 21 mg/dL (ref 8–23)
CO2: 27 mmol/L (ref 22–32)
Calcium: 10.1 mg/dL (ref 8.9–10.3)
Chloride: 108 mmol/L (ref 98–111)
Creatinine, Ser: 0.93 mg/dL (ref 0.44–1.00)
GFR, Estimated: >60 mL/min (ref >60)
Glucose, Bld: 105 mg/dL — ABNORMAL HIGH (ref 70–99)
Potassium: 4.9 mmol/L (ref 3.5–5.1)
Sodium: 139 mmol/L (ref 135–145)

## 2022-05-20 LAB — CBC
HCT: 47.1 % — ABNORMAL HIGH (ref 36.0–46.0)
Hemoglobin: 15.5 g/dL — ABNORMAL HIGH (ref 12.0–15.0)
MCH: 29.4 pg (ref 26.0–34.0)
MCHC: 32.9 g/dL (ref 30.0–36.0)
MCV: 89.2 fL (ref 80.0–100.0)
Platelets: 217 10*3/uL (ref 150–400)
RBC: 5.28 MIL/uL — ABNORMAL HIGH (ref 3.87–5.11)
RDW: 12.7 % (ref 11.5–15.5)
WBC: 4.7 10*3/uL (ref 4.0–10.5)
nRBC: 0 % (ref 0.0–0.2)

## 2022-05-20 NOTE — Progress Notes (Addendum)
For Short Stay: [facility] appointment date: Date of COVID positive in last 69 days:  Bowel Prep reminder:   For Anesthesia: PCP - Bing Matter: PA Cardiologist -   Chest x-ray -  EKG - 02/05/22 Stress Test -  ECHO -  Cardiac Cath -  Pacemaker/ICD device last checked: Pacemaker orders received: Device Rep notified:  Spinal Cord Stimulator:  Sleep Study - Yes CPAP - NO  Fasting Blood Sugar -  Checks Blood Sugar _____ times a day Date and result of last Hgb A1c-  Blood Thinner Instructions: Aspirin Instructions: Last Dose:  Activity level: Can go up a flight of stairs and activities of daily living without stopping and without chest pain and/or shortness of breath   Able to exercise without chest pain and/or shortness of breath   Unable to go up a flight of stairs without chest pain and/or shortness of breath     Anesthesia review: Hx: HTN,OSA(NO CPAP),Factor V,CKD III  Patient denies shortness of breath, fever, cough and chest pain at PAT appointment   Patient verbalized understanding of instructions that were given to them at the PAT appointment. Patient was also instructed that they will need to review over the PAT instructions again at home before surgery.

## 2022-05-21 LAB — SURGICAL PCR SCREEN
MRSA, PCR: POSITIVE — AB
Staphylococcus aureus: POSITIVE — AB

## 2022-05-21 NOTE — Progress Notes (Signed)
PCR: + MRSA./+ STAPH 

## 2022-06-01 NOTE — H&P (Signed)
TOTAL KNEE ADMISSION H&P  Patient is being admitted for left total knee arthroplasty.  Subjective:  Chief Complaint:left knee pain.  HPI: Brittany Todd, 61 y.o. female, has a history of pain and functional disability in the left knee due to arthritis and has failed non-surgical conservative treatments for greater than 12 weeks to includeNSAID's and/or analgesics.  Onset of symptoms was gradual, starting 1 years ago with gradually worsening course since that time. The patient noted no past surgery on the left knee(s).  Patient currently rates pain in the left knee(s) at 8 out of 10 with activity. Patient has worsening of pain with activity and weight bearing, pain that interferes with activities of daily living, and pain with passive range of motion.  Patient has evidence of joint space narrowing by imaging studies. There is no active infection.  Patient Active Problem List   Diagnosis Date Noted   S/P total knee arthroplasty, right 02/17/2022   OSA (obstructive sleep apnea) 12/23/2021   Left ureteral calculus 03/07/2018   Ingrown left greater toenail 02/03/2018   Chest pain, atypical 10/21/2017   Mixed dyslipidemia 10/21/2017   Hypothyroidism 10/19/2017   Menopausal symptom 10/19/2017   Chronic migraine 10/16/2017   CKD (chronic kidney disease) stage 3, GFR 30-59 ml/min (HCC) 05/11/2017   Obesity 05/11/2017   Other chest pain 12/18/2016   Hypothyroidism due to Hashimoto's thyroiditis 11/19/2016   Intractable migraine without aura and without status migrainosus 11/06/2016   Anxiety 03/10/2016   Attention deficit hyperactivity disorder 03/10/2016   Gastro-esophageal reflux disease with esophagitis 03/10/2016   Hypercholesterolemia 03/10/2016   Renal stone 03/10/2016   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   Vitamin D deficiency disease 03/10/2016   Past Medical History:  Diagnosis Date   ADD (attention deficit disorder)    Anxiety    Arthritis    Chest tightness    DOE (dyspnea on  exertion)    Factor V Leiden ([place])    GERD (gastroesophageal reflux disease)    Hashimoto's disease    History of kidney stones    Hyperlipemia    Hypertension    Hypothyroid    Migraines    PONV (postoperative nausea and vomiting)    Renal stone    Sleep apnea    TMJ (dislocation of temporomandibular joint)    Vitamin D deficiency     Past Surgical History:  Procedure Laterality Date   CESAREAN SECTION     COLONOSCOPY     CYSTOSCOPY W/ URETEROSCOPY     FINGER SURGERY Left    KNEE ARTHROSCOPY Right    TOTAL KNEE ARTHROPLASTY Right 02/17/2022   Procedure: TOTAL KNEE ARTHROPLASTY;  Surgeon: Paralee Cancel, MD;  Location: [facility];  Service: Orthopedics;  Laterality: Right;   TUBAL LIGATION     WISDOM TOOTH EXTRACTION      No current facility-administered medications for this encounter.   Current Outpatient Medications  Medication Sig Dispense Refill Last Dose   acetaminophen (TYLENOL) 325 MG tablet Take 3 tablets (975 mg total) by mouth every 6 (six) hours. (Patient taking differently: Take 650 mg by mouth every 6 (six) hours as needed for moderate pain or headache.)      acyclovir (ZOVIRAX) 400 MG tablet Take 400 mg by mouth 3 (three) times daily as needed (outbreaks).      amphetamine-dextroamphetamine (ADDERALL) 10 MG tablet Take 10 mg by mouth 2 (two) times daily with a meal.      aspirin EC 81 MG tablet Take 81 mg by mouth daily. Swallow  whole.      Cholecalciferol (VITAMIN D3) 125 MCG (5000 UT) TABS Take 5,000 Units by mouth daily.      Cyanocobalamin 5000 MCG CAPS Take 5,000 mcg by mouth daily.      ezetimibe (ZETIA) 10 MG tablet Take 10 mg by mouth daily.      levothyroxine (SYNTHROID, LEVOTHROID) 88 MCG tablet Take 88 mcg by mouth daily before breakfast.      losartan (COZAAR) 50 MG tablet Take 50 mg by mouth daily.      MAGNESIUM PO Take 400 mg by mouth daily.      meclizine (ANTIVERT) 25 MG tablet Take 12.5-25 mg by mouth 3 (three) times daily as needed for nausea or  dizziness.      omeprazole (PRILOSEC OTC) 20 MG tablet Take 20 mg by mouth daily as needed (acid reflux).      zinc gluconate 50 MG tablet Take 50 mg by mouth daily.      zonisamide (ZONEGRAN) 100 MG capsule Take 100 mg by mouth daily.      docusate sodium (COLACE) 100 MG capsule Take 1 capsule (100 mg total) by mouth 2 (two) times daily. (Patient not taking: Reported on 05/15/2022) 10 capsule 0 Not Taking   HYDROmorphone (DILAUDID) 2 MG tablet Take 1 tablet (2 mg total) by mouth every 4 (four) hours as needed for severe pain. (Patient not taking: Reported on 05/15/2022) 42 tablet 0 Not Taking   methocarbamol (ROBAXIN) 500 MG tablet Take 1 tablet (500 mg total) by mouth every 6 (six) hours as needed for muscle spasms. (Patient not taking: Reported on 05/15/2022) 40 tablet 0 Not Taking   ondansetron (ZOFRAN-ODT) 4 MG disintegrating tablet Take 1 tablet (4 mg total) by mouth every 8 (eight) hours as needed. (Patient not taking: Reported on 05/15/2022) 20 tablet 0 Not Taking   polyethylene glycol (MIRALAX / GLYCOLAX) 17 g packet Take 17 g by mouth daily as needed for mild constipation. (Patient not taking: Reported on 05/15/2022) 14 each 0 Not Taking   rivaroxaban (XARELTO) 10 MG TABS tablet Take 1 tablet (10 mg total) by mouth daily with breakfast for 14 days. Then take aspirin 81 mg twice daily for another month. (Patient not taking: Reported on 05/15/2022) 14 tablet 0 Not Taking   rosuvastatin (CRESTOR) 40 MG tablet rosuvastatin 40 mg tablet  TAKE 1 TABLET BY MOUTH ONCE DAILY (Patient not taking: Reported on 05/15/2022)   Not Taking   SUMAtriptan (IMITREX) 100 MG tablet Take 1 tablet (100 mg total) by mouth once as needed for up to 1 dose. May repeat in 2 hours if headache persists or recurs. (Patient not taking: Reported on 05/15/2022) 10 tablet 12 Not Taking   Allergies  Allergen Reactions   Ozempic (0.25 Or 0.5 Mg-Dose) [Semaglutide(0.25 Or 0.5mg -Dos)] Diarrhea and Nausea And Vomiting   Codeine Other  (See Comments)    hallucinations   Cymbalta [Duloxetine Hcl] Other (See Comments)    lethargic   Morphine And Related Other (See Comments)    Hallucinations     Social History   Tobacco Use   Smoking status: Never   Smokeless tobacco: Never  Substance Use Topics   Alcohol use: No    Family History  Problem Relation Age of Onset   Heart attack Father    Alzheimer's disease Maternal Grandmother      Review of Systems  Constitutional:  Negative for chills and fever.  Respiratory:  Negative for cough and shortness of breath.   Cardiovascular:  Negative for chest pain.  Gastrointestinal:  Negative for nausea and vomiting.  Musculoskeletal:  Positive for arthralgias.     Objective:   Physical Exam Well nourished and well developed. General: Alert and oriented x3, cooperative and pleasant, no acute distress. Head: normocephalic, atraumatic, neck supple. Eyes: EOMI.  Musculoskeletal: Left Knee: No palpable effusion, warmth erythema Tenderness at the medial aspect of the joint Stable medial and lateral collateral ligaments Full knee extension with flexion with pain tightness and stiffness to 120 degrees   Calves soft and nontender. Motor function intact in LE. Strength 5/5 LE bilaterally. Neuro: Distal pulses 2+. Sensation to light touch intact in LE.  Vital signs in last 24 hours:    Labs:   Estimated body mass index is 36.36 kg/m as calculated from the following:   Height as of 05/20/22: 4\' 11"  (1.499 m).   Weight as of 05/20/22: 81.6 kg.   Imaging Review Plain radiographs demonstrate severe degenerative joint disease of the left knee(s). The overall alignment isneutral. The bone quality appears to be adequate for age and reported activity level.      Assessment/Plan:  End stage arthritis, left knee   The patient history, physical examination, clinical judgment of the provider and imaging studies are consistent with end stage degenerative joint disease  of the left knee(s) and total knee arthroplasty is deemed medically necessary. The treatment options including medical management, injection therapy arthroscopy and arthroplasty were discussed at length. The risks and benefits of total knee arthroplasty were presented and reviewed. The risks due to aseptic loosening, infection, stiffness, patella tracking problems, thromboembolic complications and other imponderables were discussed. The patient acknowledged the explanation, agreed to proceed with the plan and consent was signed. Patient is being admitted for inpatient treatment for surgery, pain control, PT, OT, prophylactic antibiotics, VTE prophylaxis, progressive ambulation and ADL's and discharge planning. The patient is planning to be discharged  home.   Therapy Plans: outpatient therapy at [facility] Disposition: Home with husband/sister Planned DVT Prophylaxis: Xarelto 10mg  daily DME needed: none PCP: Bing Matter, PA-C - clearance received Pulm: Dr. Elsworth Soho, clearance received (OSA on CPAP) TXA: IV Allergies: codeine & morphine - hallucinations after c-section Anesthesia Concerns: nausea BMI: 37.8 Last HgbA1c: Not diabetic   Other: - Hx of OSA does not have CPAP (cannot tolerate) - Dilaudid (nausea & did not tolerate Norco/Oxy), robaxin, tylenol, celebrex after Xarelto - Send phenergan - Factor 5 Leiden - Getting repeat ESI - Stayed 2 nights with last TKA  Patient's anticipated LOS is less than 2 midnights, meeting these requirements: - Younger than 53 - Lives within 1 hour of care - Has a competent adult at home to recover with post-op recover - NO history of  - Chronic pain requiring opiods  - Diabetes  - Coronary Artery Disease  - Heart failure  - Heart attack  - Stroke  - DVT/VTE  - Cardiac arrhythmia  - Respiratory Failure/COPD  - Renal failure  - Anemia  - Advanced Liver disease  Costella Hatcher, PA-C Orthopedic Surgery [facility] [place] [phone]

## 2022-06-02 ENCOUNTER — Observation Stay (HOSPITAL_COMMUNITY)
Admission: RE | Admit: 2022-06-02 | Discharge: 2022-06-03 | Disposition: A | Discharge status: Home / Self Care | Payer: BC Managed Care – PPO | Source: Ambulatory Visit | Attending: Orthopedic Surgery | Admitting: Orthopedic Surgery

## 2022-06-02 ENCOUNTER — Encounter (HOSPITAL_COMMUNITY): Payer: Self-pay | Admitting: Orthopedic Surgery

## 2022-06-02 ENCOUNTER — Other Ambulatory Visit: Payer: Self-pay

## 2022-06-02 ENCOUNTER — Encounter (HOSPITAL_COMMUNITY)
Admission: RE | Disposition: A | Discharge status: Home / Self Care | Payer: Self-pay | Source: Ambulatory Visit | Attending: Orthopedic Surgery

## 2022-06-02 ENCOUNTER — Ambulatory Visit (HOSPITAL_COMMUNITY): Payer: BC Managed Care – PPO | Admitting: Anesthesiology

## 2022-06-02 DIAGNOSIS — E039 Hypothyroidism, unspecified: Secondary | ICD-10-CM | POA: Diagnosis not present

## 2022-06-02 DIAGNOSIS — Z7982 Long term (current) use of aspirin: Secondary | ICD-10-CM | POA: Insufficient documentation

## 2022-06-02 DIAGNOSIS — I129 Hypertensive chronic kidney disease with stage 1 through stage 4 chronic kidney disease, or unspecified chronic kidney disease: Secondary | ICD-10-CM | POA: Insufficient documentation

## 2022-06-02 DIAGNOSIS — M1712 Unilateral primary osteoarthritis, left knee: Principal | ICD-10-CM

## 2022-06-02 DIAGNOSIS — N183 Chronic kidney disease, stage 3 unspecified: Secondary | ICD-10-CM | POA: Insufficient documentation

## 2022-06-02 DIAGNOSIS — Z96652 Presence of left artificial knee joint: Secondary | ICD-10-CM

## 2022-06-02 DIAGNOSIS — Z96651 Presence of right artificial knee joint: Secondary | ICD-10-CM | POA: Diagnosis not present

## 2022-06-02 DIAGNOSIS — Z79899 Other long term (current) drug therapy: Secondary | ICD-10-CM | POA: Insufficient documentation

## 2022-06-02 HISTORY — PX: TOTAL KNEE ARTHROPLASTY: SHX125

## 2022-06-02 LAB — TYPE AND SCREEN
ABO/RH(D): O POS
Antibody Screen: NEGATIVE

## 2022-06-02 SURGERY — ARTHROPLASTY, KNEE, TOTAL
Anesthesia: Regional | Site: Knee | Laterality: Left

## 2022-06-02 MED ORDER — TRANEXAMIC ACID-NACL 1000-0.7 MG/100ML-% IV SOLN
1000.0000 mg | Freq: Once | INTRAVENOUS | Status: AC
  Administered 2022-06-02: 1000 mg via INTRAVENOUS
  Filled 2022-06-02: qty 100

## 2022-06-02 MED ORDER — VANCOMYCIN HCL IN DEXTROSE 1-5 GM/200ML-% IV SOLN
1000.0000 mg | INTRAVENOUS | Status: AC
  Administered 2022-06-02: 1000 mg via INTRAVENOUS
  Filled 2022-06-02: qty 200

## 2022-06-02 MED ORDER — SODIUM CHLORIDE (PF) 0.9 % IJ SOLN
INTRAMUSCULAR | Status: DC | PRN
  Administered 2022-06-02: 30 mL

## 2022-06-02 MED ORDER — ONDANSETRON HCL 4 MG/2ML IJ SOLN
INTRAMUSCULAR | Status: DC | PRN
  Administered 2022-06-02: 4 mg via INTRAVENOUS

## 2022-06-02 MED ORDER — RIVAROXABAN 10 MG PO TABS
10.0000 mg | ORAL_TABLET | Freq: Every day | ORAL | Status: DC
  Administered 2022-06-03: 10 mg via ORAL
  Filled 2022-06-02: qty 1

## 2022-06-02 MED ORDER — ACETAMINOPHEN 500 MG PO TABS
1000.0000 mg | ORAL_TABLET | Freq: Four times a day (QID) | ORAL | Status: DC
  Administered 2022-06-02 – 2022-06-03 (×4): 1000 mg via ORAL
  Filled 2022-06-02 (×4): qty 2

## 2022-06-02 MED ORDER — CHLORHEXIDINE GLUCONATE 0.12 % MT SOLN
15.0000 mL | Freq: Once | OROMUCOSAL | Status: AC
  Administered 2022-06-02: 15 mL via OROMUCOSAL

## 2022-06-02 MED ORDER — HYDROMORPHONE HCL 1 MG/ML IJ SOLN: 0.5000 mg | INTRAMUSCULAR | Status: DC | PRN

## 2022-06-02 MED ORDER — EZETIMIBE 10 MG PO TABS
10.0000 mg | ORAL_TABLET | Freq: Every day | ORAL | Status: DC
  Administered 2022-06-02 – 2022-06-03 (×2): 10 mg via ORAL
  Filled 2022-06-02 (×2): qty 1

## 2022-06-02 MED ORDER — PHENYLEPHRINE HCL (PRESSORS) 10 MG/ML IV SOLN
INTRAVENOUS | Status: AC
  Filled 2022-06-02: qty 1

## 2022-06-02 MED ORDER — METOCLOPRAMIDE HCL 5 MG/ML IJ SOLN
5.0000 mg | Freq: Three times a day (TID) | INTRAMUSCULAR | Status: DC | PRN
  Administered 2022-06-02 – 2022-06-03 (×2): 10 mg via INTRAVENOUS
  Filled 2022-06-02 (×2): qty 2

## 2022-06-02 MED ORDER — ONDANSETRON HCL 4 MG/2ML IJ SOLN
4.0000 mg | Freq: Four times a day (QID) | INTRAMUSCULAR | Status: DC | PRN
  Administered 2022-06-03: 4 mg via INTRAVENOUS
  Filled 2022-06-02: qty 2

## 2022-06-02 MED ORDER — CEFAZOLIN SODIUM-DEXTROSE 2-4 GM/100ML-% IV SOLN
2.0000 g | INTRAVENOUS | Status: AC
  Administered 2022-06-02: 2 g via INTRAVENOUS
  Filled 2022-06-02: qty 100

## 2022-06-02 MED ORDER — ORAL CARE MOUTH RINSE: 15.0000 mL | Freq: Once | OROMUCOSAL | Status: AC

## 2022-06-02 MED ORDER — SODIUM CHLORIDE 0.9 % IR SOLN
Status: DC | PRN
  Administered 2022-06-02: 1000 mL

## 2022-06-02 MED ORDER — BISACODYL 10 MG RE SUPP: 10.0000 mg | Freq: Every day | RECTAL | Status: DC | PRN

## 2022-06-02 MED ORDER — SODIUM CHLORIDE 0.9 % IV SOLN: INTRAVENOUS | Status: DC

## 2022-06-02 MED ORDER — DEXAMETHASONE SODIUM PHOSPHATE 10 MG/ML IJ SOLN
INTRAMUSCULAR | Status: DC | PRN
  Administered 2022-06-02: 10 mg
  Administered 2022-06-02: 5 mg

## 2022-06-02 MED ORDER — PHENYLEPHRINE HCL (PRESSORS) 10 MG/ML IV SOLN
INTRAVENOUS | Status: DC | PRN
  Administered 2022-06-02: 160 ug via INTRAVENOUS
  Administered 2022-06-02: 80 ug via INTRAVENOUS

## 2022-06-02 MED ORDER — BUPIVACAINE HCL (PF) 0.75 % IJ SOLN
INTRAMUSCULAR | Status: DC | PRN
  Administered 2022-06-02: 1.6 mL via INTRATHECAL

## 2022-06-02 MED ORDER — KETOROLAC TROMETHAMINE 30 MG/ML IJ SOLN
INTRAMUSCULAR | Status: DC | PRN
  Administered 2022-06-02: 30 mg via INTRAMUSCULAR

## 2022-06-02 MED ORDER — PROPOFOL 500 MG/50ML IV EMUL
INTRAVENOUS | Status: DC | PRN
  Administered 2022-06-02: 75 ug/kg/min via INTRAVENOUS

## 2022-06-02 MED ORDER — TRANEXAMIC ACID-NACL 1000-0.7 MG/100ML-% IV SOLN
1000.0000 mg | INTRAVENOUS | Status: AC
  Administered 2022-06-02: 1000 mg via INTRAVENOUS
  Filled 2022-06-02: qty 100

## 2022-06-02 MED ORDER — LACTATED RINGERS IV SOLN: INTRAVENOUS | Status: DC

## 2022-06-02 MED ORDER — ROSUVASTATIN CALCIUM 20 MG PO TABS: 40.0000 mg | ORAL_TABLET | Freq: Every day | ORAL | Status: DC

## 2022-06-02 MED ORDER — 0.9 % SODIUM CHLORIDE (POUR BTL) OPTIME
TOPICAL | Status: DC | PRN
  Administered 2022-06-02: 1000 mL

## 2022-06-02 MED ORDER — HYDROMORPHONE HCL 2 MG PO TABS
3.0000 mg | ORAL_TABLET | ORAL | Status: DC | PRN
  Administered 2022-06-02 – 2022-06-03 (×5): 4 mg via ORAL
  Filled 2022-06-02 (×4): qty 2

## 2022-06-02 MED ORDER — POLYETHYLENE GLYCOL 3350 17 G PO PACK: 17.0000 g | PACK | Freq: Every day | ORAL | Status: DC | PRN

## 2022-06-02 MED ORDER — METHOCARBAMOL 1000 MG/10ML IJ SOLN: 500.0000 mg | Freq: Four times a day (QID) | INTRAVENOUS | Status: DC | PRN

## 2022-06-02 MED ORDER — SODIUM CHLORIDE (PF) 0.9 % IJ SOLN
INTRAMUSCULAR | Status: AC
  Filled 2022-06-02: qty 50

## 2022-06-02 MED ORDER — DEXAMETHASONE SODIUM PHOSPHATE 10 MG/ML IJ SOLN: 8.0000 mg | Freq: Once | INTRAMUSCULAR | Status: DC

## 2022-06-02 MED ORDER — MENTHOL 3 MG MT LOZG: 1.0000 | LOZENGE | OROMUCOSAL | Status: DC | PRN

## 2022-06-02 MED ORDER — KETOROLAC TROMETHAMINE 15 MG/ML IJ SOLN
7.5000 mg | Freq: Four times a day (QID) | INTRAMUSCULAR | Status: DC
  Filled 2022-06-02: qty 1

## 2022-06-02 MED ORDER — LOSARTAN POTASSIUM 50 MG PO TABS
50.0000 mg | ORAL_TABLET | Freq: Every day | ORAL | Status: DC
  Administered 2022-06-03: 50 mg via ORAL
  Filled 2022-06-02: qty 1

## 2022-06-02 MED ORDER — SCOPOLAMINE 1 MG/3DAYS TD PT72
MEDICATED_PATCH | TRANSDERMAL | Status: AC
  Filled 2022-06-02: qty 1

## 2022-06-02 MED ORDER — ACETAMINOPHEN 500 MG PO TABS
1000.0000 mg | ORAL_TABLET | Freq: Once | ORAL | Status: AC
  Administered 2022-06-02: 1000 mg via ORAL
  Filled 2022-06-02: qty 2

## 2022-06-02 MED ORDER — FENTANYL CITRATE PF 50 MCG/ML IJ SOSY: 25.0000 ug | PREFILLED_SYRINGE | INTRAMUSCULAR | Status: DC | PRN

## 2022-06-02 MED ORDER — PHENYLEPHRINE HCL-NACL 20-0.9 MG/250ML-% IV SOLN
INTRAVENOUS | Status: DC | PRN
  Administered 2022-06-02: 40 ug/min via INTRAVENOUS

## 2022-06-02 MED ORDER — METHOCARBAMOL 500 MG PO TABS
500.0000 mg | ORAL_TABLET | Freq: Four times a day (QID) | ORAL | Status: DC | PRN
  Administered 2022-06-03: 500 mg via ORAL
  Filled 2022-06-02 (×2): qty 1

## 2022-06-02 MED ORDER — AMPHETAMINE-DEXTROAMPHETAMINE 10 MG PO TABS
10.0000 mg | ORAL_TABLET | Freq: Two times a day (BID) | ORAL | Status: DC
  Administered 2022-06-02 – 2022-06-03 (×2): 10 mg via ORAL
  Filled 2022-06-02 (×2): qty 1

## 2022-06-02 MED ORDER — HYDROMORPHONE HCL 2 MG PO TABS
1.0000 mg | ORAL_TABLET | ORAL | Status: DC | PRN
  Filled 2022-06-02 (×3): qty 1

## 2022-06-02 MED ORDER — POVIDONE-IODINE 10 % EX SWAB
2.0000 | Freq: Once | CUTANEOUS | Status: AC
  Administered 2022-06-02: 2 via TOPICAL

## 2022-06-02 MED ORDER — MECLIZINE HCL 25 MG PO TABS: 12.5000 mg | ORAL_TABLET | Freq: Three times a day (TID) | ORAL | Status: DC | PRN

## 2022-06-02 MED ORDER — PHENOL 1.4 % MT LIQD: 1.0000 | OROMUCOSAL | Status: DC | PRN

## 2022-06-02 MED ORDER — DEXAMETHASONE SODIUM PHOSPHATE 10 MG/ML IJ SOLN
10.0000 mg | Freq: Once | INTRAMUSCULAR | Status: AC
  Administered 2022-06-03: 10 mg via INTRAVENOUS
  Filled 2022-06-02: qty 1

## 2022-06-02 MED ORDER — LEVOTHYROXINE SODIUM 88 MCG PO TABS
88.0000 ug | ORAL_TABLET | Freq: Every day | ORAL | Status: DC
  Administered 2022-06-03: 88 ug via ORAL
  Filled 2022-06-02: qty 1

## 2022-06-02 MED ORDER — FERROUS SULFATE 325 (65 FE) MG PO TABS
325.0000 mg | ORAL_TABLET | Freq: Three times a day (TID) | ORAL | Status: DC
  Administered 2022-06-02 – 2022-06-03 (×3): 325 mg via ORAL
  Filled 2022-06-02 (×3): qty 1

## 2022-06-02 MED ORDER — PANTOPRAZOLE SODIUM 40 MG PO TBEC: 40.0000 mg | DELAYED_RELEASE_TABLET | Freq: Every day | ORAL | Status: DC | PRN

## 2022-06-02 MED ORDER — CEFAZOLIN SODIUM-DEXTROSE 2-4 GM/100ML-% IV SOLN
2.0000 g | Freq: Four times a day (QID) | INTRAVENOUS | Status: AC
  Administered 2022-06-02 – 2022-06-03 (×2): 2 g via INTRAVENOUS
  Filled 2022-06-02 (×2): qty 100

## 2022-06-02 MED ORDER — SCOPOLAMINE 1 MG/3DAYS TD PT72
1.0000 | MEDICATED_PATCH | Freq: Once | TRANSDERMAL | Status: DC
  Administered 2022-06-02: 1.5 mg via TRANSDERMAL

## 2022-06-02 MED ORDER — ONDANSETRON HCL 4 MG PO TABS
4.0000 mg | ORAL_TABLET | Freq: Four times a day (QID) | ORAL | Status: DC | PRN
  Administered 2022-06-03: 4 mg via ORAL
  Filled 2022-06-02: qty 1

## 2022-06-02 MED ORDER — DOCUSATE SODIUM 100 MG PO CAPS
100.0000 mg | ORAL_CAPSULE | Freq: Two times a day (BID) | ORAL | Status: DC
  Administered 2022-06-02 – 2022-06-03 (×2): 100 mg via ORAL
  Filled 2022-06-02 (×2): qty 1

## 2022-06-02 MED ORDER — METOCLOPRAMIDE HCL 5 MG PO TABS: 5.0000 mg | ORAL_TABLET | Freq: Three times a day (TID) | ORAL | Status: DC | PRN

## 2022-06-02 MED ORDER — KETOROLAC TROMETHAMINE 15 MG/ML IJ SOLN
7.5000 mg | Freq: Four times a day (QID) | INTRAMUSCULAR | Status: AC
  Administered 2022-06-02: 7.5 mg via INTRAVENOUS
  Filled 2022-06-02: qty 1

## 2022-06-02 MED ORDER — FENTANYL CITRATE PF 50 MCG/ML IJ SOSY
50.0000 ug | PREFILLED_SYRINGE | INTRAMUSCULAR | Status: DC
  Administered 2022-06-02: 50 ug via INTRAVENOUS
  Filled 2022-06-02: qty 2

## 2022-06-02 MED ORDER — MIDAZOLAM HCL 2 MG/2ML IJ SOLN
1.0000 mg | INTRAMUSCULAR | Status: DC
  Administered 2022-06-02: 2 mg via INTRAVENOUS
  Filled 2022-06-02: qty 2

## 2022-06-02 MED ORDER — ROPIVACAINE HCL 5 MG/ML IJ SOLN
INTRAMUSCULAR | Status: DC | PRN
  Administered 2022-06-02: 20 mL via PERINEURAL

## 2022-06-02 MED ORDER — BUPIVACAINE-EPINEPHRINE (PF) 0.25% -1:200000 IJ SOLN
INTRAMUSCULAR | Status: DC | PRN
  Administered 2022-06-02: 30 mL

## 2022-06-02 MED ORDER — OMEPRAZOLE MAGNESIUM 20 MG PO TBEC: 20.0000 mg | DELAYED_RELEASE_TABLET | Freq: Every day | ORAL | Status: DC | PRN

## 2022-06-02 MED ORDER — ACETAMINOPHEN 325 MG PO TABS: 325.0000 mg | ORAL_TABLET | Freq: Four times a day (QID) | ORAL | Status: DC | PRN

## 2022-06-02 MED ORDER — DIPHENHYDRAMINE HCL 12.5 MG/5ML PO ELIX: 12.5000 mg | ORAL_SOLUTION | ORAL | Status: DC | PRN

## 2022-06-02 MED ORDER — KETOROLAC TROMETHAMINE 30 MG/ML IJ SOLN
INTRAMUSCULAR | Status: AC
  Filled 2022-06-02: qty 1

## 2022-06-02 MED ORDER — BUPIVACAINE-EPINEPHRINE (PF) 0.25% -1:200000 IJ SOLN
INTRAMUSCULAR | Status: AC
  Filled 2022-06-02: qty 30

## 2022-06-02 SURGICAL SUPPLY — 51 items
ATTUNE MED ANAT PAT 35 KNEE (Knees) IMPLANT
ATTUNE PSFEM LTSZ4 NARCEM KNEE (Femur) IMPLANT
ATTUNE PSRP INSR SZ4 7 KNEE (Insert) IMPLANT
BAG COUNTER SPONGE SURGICOUNT (BAG) IMPLANT
BAG ZIPLOCK 12X15 (MISCELLANEOUS) IMPLANT
BASE TIBIAL ROT PLAT SZ 3 KNEE (Knees) IMPLANT
BLADE SAW SGTL 11.0X1.19X90.0M (BLADE) IMPLANT
BLADE SAW SGTL 13.0X1.19X90.0M (BLADE) ×1 IMPLANT
BNDG ELASTIC 6X5.8 VLCR STR LF (GAUZE/BANDAGES/DRESSINGS) ×1 IMPLANT
BOWL SMART MIX CTS (DISPOSABLE) ×1 IMPLANT
CEMENT HV SMART SET (Cement) IMPLANT
CUFF TOURN SGL QUICK 34 (TOURNIQUET CUFF) ×1
CUFF TRNQT CYL 34X4.125X (TOURNIQUET CUFF) ×1 IMPLANT
DERMABOND ADVANCED .7 DNX12 (GAUZE/BANDAGES/DRESSINGS) ×1 IMPLANT
DRAPE U-SHAPE 47X51 STRL (DRAPES) ×1 IMPLANT
DRESSING AQUACEL AG SP 3.5X10 (GAUZE/BANDAGES/DRESSINGS) ×1 IMPLANT
DRSG AQUACEL AG ADV 3.5X10 (GAUZE/BANDAGES/DRESSINGS) IMPLANT
DRSG AQUACEL AG SP 3.5X10 (GAUZE/BANDAGES/DRESSINGS) ×1
DURAPREP 26ML APPLICATOR (WOUND CARE) ×2 IMPLANT
ELECT REM PT RETURN 15FT ADLT (MISCELLANEOUS) ×1 IMPLANT
GLOVE BIO SURGEON STRL SZ 6 (GLOVE) ×1 IMPLANT
GLOVE BIOGEL PI IND STRL 6.5 (GLOVE) ×1 IMPLANT
GLOVE BIOGEL PI IND STRL 7.5 (GLOVE) ×1 IMPLANT
GLOVE ORTHO TXT STRL SZ7.5 (GLOVE) ×2 IMPLANT
GOWN STRL REUS W/ TWL LRG LVL3 (GOWN DISPOSABLE) ×2 IMPLANT
GOWN STRL REUS W/TWL LRG LVL3 (GOWN DISPOSABLE) ×2
HANDPIECE INTERPULSE COAX TIP (DISPOSABLE) ×1
HOLDER FOLEY CATH W/STRAP (MISCELLANEOUS) IMPLANT
KIT TURNOVER KIT A (KITS) IMPLANT
MANIFOLD NEPTUNE II (INSTRUMENTS) ×1 IMPLANT
NDL SAFETY ECLIP 18X1.5 (MISCELLANEOUS) IMPLANT
NS IRRIG 1000ML POUR BTL (IV SOLUTION) ×1 IMPLANT
PACK TOTAL KNEE CUSTOM (KITS) ×1 IMPLANT
PIN FIX SIGMA LCS THRD HI (PIN) IMPLANT
PROTECTOR NERVE ULNAR (MISCELLANEOUS) ×1 IMPLANT
SET HNDPC FAN SPRY TIP SCT (DISPOSABLE) ×1 IMPLANT
SET PAD KNEE POSITIONER (MISCELLANEOUS) ×1 IMPLANT
SPIKE FLUID TRANSFER (MISCELLANEOUS) ×2 IMPLANT
SUT MNCRL AB 4-0 PS2 18 (SUTURE) ×1 IMPLANT
SUT STRATAFIX PDS+ 0 24IN (SUTURE) ×1 IMPLANT
SUT VIC AB 1 CT1 36 (SUTURE) ×1 IMPLANT
SUT VIC AB 2-0 CT1 27 (SUTURE) ×2
SUT VIC AB 2-0 CT1 TAPERPNT 27 (SUTURE) ×2 IMPLANT
SYR 3ML LL SCALE MARK (SYRINGE) ×1 IMPLANT
TIBIAL BASE ROT PLAT SZ 3 KNEE (Knees) ×1 IMPLANT
TOWEL GREEN STERILE FF (TOWEL DISPOSABLE) ×1 IMPLANT
TRAY FOLEY MTR SLVR 14FR STAT (SET/KITS/TRAYS/PACK) IMPLANT
TRAY FOLEY MTR SLVR 16FR STAT (SET/KITS/TRAYS/PACK) ×1 IMPLANT
TUBE SUCTION HIGH CAP CLEAR NV (SUCTIONS) ×1 IMPLANT
WATER STERILE IRR 1000ML POUR (IV SOLUTION) ×2 IMPLANT
WRAP KNEE MAXI GEL POST OP (GAUZE/BANDAGES/DRESSINGS) ×1 IMPLANT

## 2022-06-02 NOTE — Interval H&P Note (Signed)
History and Physical Interval Note:  06/02/2022 11:27 AM  Brittany Todd  has presented today for surgery, with the diagnosis of Left knee osteoarthritis.  The various methods of treatment have been discussed with the patient and family. After consideration of risks, benefits and other options for treatment, the patient has consented to  Procedure(s): TOTAL KNEE ARTHROPLASTY (Left) as a surgical intervention.  The patient's history has been reviewed, patient examined, no change in status, stable for surgery.  I have reviewed the patient's chart and labs.  Questions were answered to the patient's satisfaction.     Mauri Pole

## 2022-06-02 NOTE — Transfer of Care (Signed)
Immediate Anesthesia Transfer of Care Note  Patient: Brittany Todd  Procedure(s) Performed: TOTAL KNEE ARTHROPLASTY (Left: Knee)  Patient Location: PACU  Anesthesia Type:Spinal  Level of Consciousness: sedated, patient cooperative and responds to stimulation  Airway & Oxygen Therapy: Patient Spontanous Breathing and Patient connected to face mask oxygen  Post-op Assessment: Report given to RN and Post -op Vital signs reviewed and stable  Post vital signs: Reviewed and stable  Last Vitals:  Vitals Value Taken Time  BP 107/70 06/02/22 1440  Temp    Pulse 71 06/02/22 1442  Resp 17 06/02/22 1442  SpO2 100 % 06/02/22 1442  Vitals shown include unvalidated device data.  Last Pain:  Vitals:   06/02/22 1212  TempSrc: Oral  PainSc:       Patients Stated Pain Goal: 4 (04/14/47 1856)  Complications: No notable events documented.

## 2022-06-02 NOTE — Discharge Instructions (Addendum)

## 2022-06-02 NOTE — Anesthesia Procedure Notes (Signed)
Spinal  Start time: 06/02/2022 12:50 PM End time: 06/02/2022 12:53 PM Staffing Performed: resident/CRNA  Resident/CRNA: Gean Maidens, CRNA Performed by: Gean Maidens, CRNA Authorized by: Freddrick March, MD   Preanesthetic Checklist Completed: patient identified, IV checked, site marked, risks and benefits discussed, surgical consent, monitors and equipment checked, pre-op evaluation and timeout performed Spinal Block Patient position: sitting Prep: DuraPrep Patient monitoring: heart rate, continuous pulse ox and blood pressure Approach: midline Location: L3-4 Injection technique: single-shot Needle Needle type: Pencan  Needle gauge: 24 G Needle length: 9 cm Needle insertion depth: 6 cm

## 2022-06-02 NOTE — Plan of Care (Signed)

## 2022-06-02 NOTE — Anesthesia Preprocedure Evaluation (Addendum)
Anesthesia Evaluation  Patient identified by MRN, date of birth, ID band Patient awake    Reviewed: Allergy & Precautions, NPO status , Patient's Chart, lab work & pertinent test results  History of Anesthesia Complications (+) PONV and history of anesthetic complications  Airway Mallampati: II  TM Distance: >3 FB Neck ROM: Full    Dental no notable dental hx. (+) Teeth Intact, Dental Advisory Given   Pulmonary sleep apnea (noncompliant with CPAP) ,    Pulmonary exam normal breath sounds clear to auscultation       Cardiovascular hypertension, Pt. on medications Normal cardiovascular exam Rhythm:Regular Rate:Normal     Neuro/Psych  Headaches, PSYCHIATRIC DISORDERS Anxiety    GI/Hepatic Neg liver ROS, GERD  ,  Endo/Other  Hypothyroidism   Renal/GU Renal InsufficiencyRenal disease  negative genitourinary   Musculoskeletal negative musculoskeletal ROS (+)   Abdominal   Peds  (+) ATTENTION DEFICIT DISORDER WITHOUT HYPERACTIVITY Hematology  (+) Blood dyscrasia (Factor V Leiden, on asa, no other anti-coagulation), ,   Anesthesia Other Findings   Reproductive/Obstetrics                          Anesthesia Physical Anesthesia Plan  ASA: 3  Anesthesia Plan: Spinal and Regional   Post-op Pain Management: Regional block* and Tylenol PO (pre-op)*   Induction:   PONV Risk Score and Plan: 3 and Treatment may vary due to age or medical condition, Midazolam, Ondansetron, Dexamethasone and Scopolamine patch - Pre-op  Airway Management Planned: Natural Airway  Additional Equipment:   Intra-op Plan:   Post-operative Plan:   Informed Consent: I have reviewed the patients History and Physical, chart, labs and discussed the procedure including the risks, benefits and alternatives for the proposed anesthesia with the patient or authorized representative who has indicated his/her understanding and  acceptance.     Dental advisory given  Plan Discussed with: CRNA  Anesthesia Plan Comments:        Anesthesia Quick Evaluation

## 2022-06-02 NOTE — Op Note (Signed)
NAME:  Brittany Todd                      MEDICAL RECORD NO.:  [id number]                             FACILITY:  [facility]      PHYSICIAN:  Madlyn Frankel. Charlann Boxer, M.D.  DATE OF BIRTH:  1961/05/25      DATE OF PROCEDURE:  06/02/2022                                     OPERATIVE REPORT         PREOPERATIVE DIAGNOSIS:  Left knee osteoarthritis.      POSTOPERATIVE DIAGNOSIS:  Left knee osteoarthritis.      FINDINGS:  The patient was noted to have complete loss of cartilage and   bone-on-bone arthritis with associated osteophytes in the medial and patellofemoral compartments of   the knee with a significant synovitis and associated effusion.  The patient had failed months of conservative treatment including medications, injection therapy, activity modification.     PROCEDURE:  Left total knee replacement.      COMPONENTS USED:  DePuy Attune rotating platform posterior stabilized knee   system, a size 4N femur, 3 tibia, size 7 mm PS AOX insert, and 35 anatomic patellar   button.      SURGEON:  Madlyn Frankel. Charlann Boxer, M.D.      ASSISTANT:  Rosalene Billings, PA-C.      ANESTHESIA:  Regional and Spinal.      SPECIMENS:  None.      COMPLICATION:  None.      DRAINS:  None.  EBL: <300cc      TOURNIQUET TIME:  30 min at 225 mmHg     The patient was stable to the recovery room.      INDICATION FOR PROCEDURE:  Brittany Todd is a 61 y.o. female patient of   mine.  The patient had been seen, evaluated, and treated for months conservatively in the   office with medication, activity modification, and injections.  The patient had   radiographic changes of bone-on-bone arthritis with endplate sclerosis and osteophytes noted.  Based on the radiographic changes and failed conservative measures, the patient   decided to proceed with definitive treatment, total knee replacement.  Risks of infection, DVT, component failure, need for revision surgery, neurovascular injury were reviewed in the office setting.   The postop course was reviewed stressing the efforts to maximize post-operative satisfaction and function.  Consent was obtained for benefit of pain   relief.      PROCEDURE IN DETAIL:  The patient was brought to the operative theater.   Once adequate anesthesia, preoperative antibiotics, 2 gm of Ancef,1 gm of Tranexamic Acid, and 10 mg of Decadron administered, the patient was positioned supine with a left thigh tourniquet placed.  The  left lower extremity was prepped and draped in sterile fashion.  A time-   out was performed identifying the patient, planned procedure, and the appropriate extremity.      The left lower extremity was placed in the [facility] leg holder.  The leg was   exsanguinated, tourniquet elevated to 225 mmHg.  A midline incision was   made followed by median parapatellar arthrotomy.  Following initial   exposure, attention was first  directed to the patella.  Precut   measurement was noted to be 21 mm.  I resected down to 13 mm and used a   35 anatomic patellar button to restore patellar height as well as cover the cut surface.      The lug holes were drilled and a metal shim was placed to protect the   patella from retractors and saw blade during the procedure.      At this Todd, attention was now directed to the femur.  The femoral   canal was opened with a drill, irrigated to try to prevent fat emboli.  An   intramedullary rod was passed at 3 degrees valgus, 9 mm of bone was   resected off the distal femur.  Following this resection, the tibia was   subluxated anteriorly.  Using the extramedullary guide, 2 mm of bone was resected off   the proximal medial tibia.  We confirmed the gap would be   stable medially and laterally with a size 5 spacer block as well as confirmed that the tibial cut was perpendicular in the coronal plane, checking with an alignment rod.      Once this was done, I sized the femur to be a size 4 in the anterior-   posterior dimension, chose a  narrow component based on medial and   lateral dimension.  The size 4 rotation block was then pinned in   position anterior referenced using the C-clamp to set rotation.  The   anterior, posterior, and  chamfer cuts were made without difficulty nor   notching making certain that I was along the anterior cortex to help   with flexion gap stability.      The final box cut was made off the lateral aspect of distal femur.      At this Todd, the tibia was sized to be a size 3.  The size 3 tray was   then pinned in position through the medial third of the tubercle,   drilled, and keel punched.  Trial reduction was now carried with a 4 femur,  3 tibia, a size 7 mm PS insert, and the 35 anatomic patella botton.  The knee was brought to full extension with good flexion stability with the patella   tracking through the trochlea without application of pressure.  Given   all these findings the trial components removed.  Final components were   opened and cement was mixed.  The knee was irrigated with normal saline solution and pulse lavage.  The synovial lining was   then injected with 30 cc of 0.25% Marcaine with epinephrine, 1 cc of Toradol and 30 cc of NS for a total of 61 cc.     Final implants were then cemented onto cleaned and dried cut surfaces of bone with the knee brought to extension with a size 7 mm PS trial insert.      Once the cement had fully cured, excess cement was removed   throughout the knee.  I confirmed that I was satisfied with the range of   motion and stability, and the final size 7 mm PS AOX insert was chosen.  It was   placed into the knee.      The tourniquet had been let down at 30 minutes.  No significant   hemostasis was required.  The extensor mechanism was then reapproximated using #1 Vicryl and #1 Stratafix sutures with the knee   in flexion.  The  remaining wound was closed with 2-0 Vicryl and running 4-0 Monocryl.   The knee was cleaned, dried, dressed  sterilely using Dermabond and   Aquacel dressing.  The patient was then   brought to recovery room in stable condition, tolerating the procedure   well.   Please note that Physician Assistant, Rosalene Billings, PA-C was present for the entirety of the case, and was utilized for pre-operative positioning, peri-operative retractor management, general facilitation of the procedure and for primary wound closure at the end of the case.              Madlyn Frankel Charlann Boxer, M.D.    06/02/2022 11:32 AM

## 2022-06-02 NOTE — Anesthesia Postprocedure Evaluation (Signed)
Anesthesia Post Note  Patient: [name]  Procedure(s) Performed: TOTAL KNEE ARTHROPLASTY (Left: Knee)     Patient location during evaluation: PACU Anesthesia Type: Regional and Spinal Level of consciousness: oriented and awake and alert Pain management: pain level controlled Vital Signs Assessment: post-procedure vital signs reviewed and stable Respiratory status: spontaneous breathing, respiratory function stable and patient connected to nasal cannula oxygen Cardiovascular status: blood pressure returned to baseline and stable Postop Assessment: no headache, no backache and no apparent nausea or vomiting Anesthetic complications: no   No notable events documented.  Last Vitals:  Vitals:   06/02/22 1530 06/02/22 1545  BP: 116/84 109/72  Pulse: 66 70  Resp: 16 17  Temp:    SpO2: 100% 100%    Last Pain:  Vitals:   06/02/22 1545  TempSrc:   PainSc: 0-No pain                 Jep Dyas L Veasna Santibanez

## 2022-06-02 NOTE — Evaluation (Signed)
Physical Therapy Evaluation Patient Details Name: Brittany Todd MRN: [id number] DOB: 07-10-1961 Today's Date: 06/02/2022  History of Present Illness  Pt is a 61yo female presenting s/p L-TKA on 06/02/22. 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   Clinical Impression  Brittany Todd is a 61 y.o. female POD 0 s/p L-TKA. Patient reports independence with mobility at baseline. Of note: pt had recent R-TKA 01/2022 and is reporting some lingering R kneecap pain. Patient is now limited by functional impairments (see PT problem list below) and requires supervision for bed mobility and min guard for transfers. Patient was able to ambulate 10 feet with RW and min guard level of assist. Patient instructed in exercise to facilitate ROM and circulation to manage edema. Provided incentive spirometer and with Vcs pt able to achieve 1849mL. Patient will benefit from continued skilled PT interventions to address impairments and progress towards PLOF. Acute PT will follow to progress mobility and stair training in preparation for safe discharge home.       Recommendations for follow up therapy are one component of a multi-disciplinary discharge planning process, led by the attending physician.  Recommendations may be updated based on patient status, additional functional criteria and insurance authorization.  Follow Up Recommendations Follow physician's recommendations for discharge plan and follow up therapies      Assistance Recommended at Discharge Frequent or constant Supervision/Assistance  Patient can return home with the following  A little help with walking and/or transfers;A little help with bathing/dressing/bathroom;Assistance with cooking/housework;Assist for transportation;Help with stairs or ramp for entrance    Equipment Recommendations None recommended by PT  Recommendations for Other Services        Functional Status Assessment Patient has had a recent decline in their functional status and demonstrates the ability to make significant improvements in function in a reasonable and predictable amount of time.     Precautions / Restrictions Precautions Precautions: Fall Restrictions Weight Bearing Restrictions: No Other Position/Activity Restrictions: wbat      Mobility  Bed Mobility Overal bed mobility: Needs Assistance Bed Mobility: Supine to Sit     Supine to sit: Supervision     General bed mobility comments: Supervision for safety no physical assist required, pt utilized bed rails. Upon sitting upright, pt reporting some nausea that resolved with ~60s seated rest break.    Transfers Overall transfer level: Needs assistance Equipment used: Rolling walker (2 wheels) Transfers: Sit to/from Stand Sit to Stand: Min guard           General transfer comment: Min guard for safety, no physical assist required or overt LOB noted.    Ambulation/Gait Ambulation/Gait assistance: Min guard Gait Distance (Feet): 10 Feet Assistive device: Rolling walker (2 wheels) Gait Pattern/deviations: Step-to pattern Gait velocity: decreased     General Gait Details: Pt ambulated with RW and min guard, no physical assist required or overt LOB noted. Distance kept short in-room secondary to mild nausea. Further mobility deferred  Stairs            Wheelchair Mobility    Modified Rankin (Stroke Patients Only)       Balance Overall balance assessment: Needs assistance Sitting-balance support: Feet supported, No upper extremity supported Sitting balance-Leahy Scale: Good     Standing balance support: Reliant on assistive device for balance, During functional activity, Bilateral upper extremity supported Standing balance-Leahy Scale: Poor  Pertinent Vitals/Pain Pain Assessment Pain Assessment: 0-10 Pain Score: 8  Pain Location:  Left knee Pain Descriptors / Indicators: Operative site guarding Pain Intervention(s): Limited activity within patient's tolerance, Monitored during session, Repositioned, Ice applied    Home Living Family/patient expects to be discharged to:: Private residence Living Arrangements: Spouse/significant other Available Help at Discharge: Family;Available 24 hours/day Type of Home: House Home Access: Stairs to enter Entrance Stairs-Rails: None Entrance Stairs-Number of Steps: 1 and 1   Home Layout: One level Home Equipment: Shower seat - built Char[employer] fundraiser (2 wheels);Cane - single point;BSC/3in1      Prior Function Prior Level of Function : Independent/Modified Independent;Driving             Mobility Comments: ind ADLs Comments: iND     Hand Dominance        Extremity/Trunk Assessment   Upper Extremity Assessment Upper Extremity Assessment: Overall WFL for tasks assessed    Lower Extremity Assessment Lower Extremity Assessment: RLE deficits/detail;LLE deficits/detail RLE Deficits / Details: MMT ank DF/PF 5/5 RLE Sensation: WNL LLE Deficits / Details: MMT ank DF/PF 5/5, no extensor lag ntoed LLE Sensation: WNL    Cervical / Trunk Assessment Cervical / Trunk Assessment: Kyphotic  Communication   Communication: No difficulties  Cognition Arousal/Alertness: Awake/alert Behavior During Therapy: WFL for tasks assessed/performed Overall Cognitive Status: Within Functional Limits for tasks assessed                                          General Comments General comments (skin integrity, edema, etc.): Husband Cindee Lame present    Exercises Total Joint Exercises Ankle Circles/Pumps: AROM, Both, 20 reps   Assessment/Plan    PT Assessment Patient needs continued PT services  PT Problem List Decreased strength;Decreased range of motion;Decreased activity tolerance;Decreased balance;Decreased mobility;Decreased coordination;Pain       PT  Treatment Interventions DME instruction;Gait training;Stair training;Functional mobility training;Therapeutic activities;Therapeutic exercise;Balance training;Neuromuscular re-education;Patient/family education    PT Goals (Current goals can be found in the Care Plan section)  Acute Rehab PT Goals Patient Stated Goal: Increase independence PT Goal Formulation: With patient Time For Goal Achievement: 06/09/22 Potential to Achieve Goals: Good    Frequency 7X/week     Co-evaluation               AM-PAC PT "6 Clicks" Mobility  Outcome Measure Help needed turning from your back to your side while in a flat bed without using bedrails?: None Help needed moving from lying on your back to sitting on the side of a flat bed without using bedrails?: A Little Help needed moving to and from a bed to a chair (including a wheelchair)?: A Little Help needed standing up from a chair using your arms (e.g., wheelchair or bedside chair)?: A Little Help needed to walk in hospital room?: A Little Help needed climbing 3-5 steps with a railing? : A Little 6 Click Score: 19    End of Session Equipment Utilized During Treatment: Gait belt Activity Tolerance: Patient tolerated treatment well;No increased pain Patient left: in chair;with call bell/phone within reach;with chair alarm set;with family/visitor present;with SCD's reapplied Nurse Communication: Mobility status PT Visit Diagnosis: Pain;Difficulty in walking, not elsewhere classified (R26.2) Pain - Right/Left: Left Pain - part of body: Knee (Pt reporting lingering pain in R kneecap from R-TKA 01/2022)    Time: 4332-9518 PT Time Calculation (min) (ACUTE ONLY): 23 min  Charges:   PT Evaluation $PT Eval Low Complexity: 1 Low          Jamesetta Geralds, [place], DPT [facility] Department Office: [phone] Weekend pager: [phone]  Jamesetta Geralds 06/02/2022, 6:12 PM

## 2022-06-02 NOTE — Anesthesia Procedure Notes (Signed)
Anesthesia Regional Block: Adductor canal block   Pre-Anesthetic Checklist: , timeout performed,  Correct Patient, Correct Site, Correct Laterality,  Correct Procedure, Correct Position, site marked,  Risks and benefits discussed,  Pre-op evaluation,  At surgeon's request and post-op pain management  Laterality: Left  Prep: Maximum Sterile Barrier Precautions used, chloraprep       Needles:  Injection technique: Single-shot  Needle Type: Echogenic Stimulator Needle     Needle Length: 9cm  Needle Gauge: 21     Additional Needles:   Procedures:,,,, ultrasound used (permanent image in chart),,    Narrative:  Start time: 06/02/2022 12:13 PM End time: 06/02/2022 12:15 PM Injection made incrementally with aspirations every 5 mL. Anesthesiologist: Freddrick March, MD

## 2022-06-02 NOTE — Plan of Care (Signed)
  Problem: Activity: Goal: Ability to avoid complications of mobility impairment will improve Outcome: Progressing Goal: Range of joint motion will improve Outcome: Progressing   Problem: Pain Management: Goal: Pain level will decrease with appropriate interventions Outcome: Progressing   

## 2022-06-03 ENCOUNTER — Encounter (HOSPITAL_COMMUNITY): Payer: Self-pay | Admitting: Orthopedic Surgery

## 2022-06-03 DIAGNOSIS — M1712 Unilateral primary osteoarthritis, left knee: Secondary | ICD-10-CM | POA: Diagnosis not present

## 2022-06-03 LAB — BASIC METABOLIC PANEL
Anion gap: 7 (ref 5–15)
BUN: 18 mg/dL (ref 8–23)
CO2: 22 mmol/L (ref 22–32)
Calcium: 9.2 mg/dL (ref 8.9–10.3)
Chloride: 108 mmol/L (ref 98–111)
Creatinine, Ser: 0.84 mg/dL (ref 0.44–1.00)
GFR, Estimated: >60 mL/min (ref >60)
Glucose, Bld: 177 mg/dL — ABNORMAL HIGH (ref 70–99)
Potassium: 4.7 mmol/L (ref 3.5–5.1)
Sodium: 137 mmol/L (ref 135–145)

## 2022-06-03 LAB — CBC
HCT: 39.8 % (ref 36.0–46.0)
Hemoglobin: 12.8 g/dL (ref 12.0–15.0)
MCH: 29.6 pg (ref 26.0–34.0)
MCHC: 32.2 g/dL (ref 30.0–36.0)
MCV: 91.9 fL (ref 80.0–100.0)
Platelets: 176 10*3/uL (ref 150–400)
RBC: 4.33 MIL/uL (ref 3.87–5.11)
RDW: 12.6 % (ref 11.5–15.5)
WBC: 12.8 10*3/uL — ABNORMAL HIGH (ref 4.0–10.5)
nRBC: 0 % (ref 0.0–0.2)

## 2022-06-03 MED ORDER — POLYETHYLENE GLYCOL 3350 17 G PO PACK: 17.0000 g | PACK | Freq: Two times a day (BID) | ORAL | 0 refills | Status: AC

## 2022-06-03 MED ORDER — RIVAROXABAN 10 MG PO TABS: 10.0000 mg | ORAL_TABLET | Freq: Every day | ORAL | 0 refills | Status: AC

## 2022-06-03 MED ORDER — ONDANSETRON HCL 4 MG PO TABS: 4.0000 mg | ORAL_TABLET | Freq: Four times a day (QID) | ORAL | 0 refills | Status: AC | PRN

## 2022-06-03 MED ORDER — HYDROMORPHONE HCL 2 MG PO TABS: 2.0000 mg | ORAL_TABLET | ORAL | 0 refills | Status: AC | PRN

## 2022-06-03 MED ORDER — ACETAMINOPHEN 500 MG PO TABS: 1000.0000 mg | ORAL_TABLET | Freq: Four times a day (QID) | ORAL | 0 refills | Status: AC

## 2022-06-03 MED ORDER — METHOCARBAMOL 500 MG PO TABS: 500.0000 mg | ORAL_TABLET | Freq: Four times a day (QID) | ORAL | 1 refills | Status: AC | PRN

## 2022-06-03 MED ORDER — METOCLOPRAMIDE HCL 5 MG PO TABS: 5.0000 mg | ORAL_TABLET | Freq: Three times a day (TID) | ORAL | 2 refills | Status: AC | PRN

## 2022-06-03 NOTE — Plan of Care (Signed)

## 2022-06-03 NOTE — Progress Notes (Signed)
Provided discharge education/instructions, all questions and concerns addressed. Pt not in acute distress, discharged home with belongings accompanied by husband. 

## 2022-06-03 NOTE — Progress Notes (Signed)
Physical Therapy Treatment Patient Details Name: Brittany Todd MRN: [id number] DOB: 1961-04-25 Today's Date: 06/03/2022   History of Present Illness Pt is a 61yo female presenting s/p L-TKA on 06/02/22. 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    PT Comments    Pt assisted to bathroom, ambulated in hallway and performed LE exercises.  Pt eager to d/c home today.  Will return to practice step and ambulate once more.      Recommendations for follow up therapy are one component of a multi-disciplinary discharge planning process, led by the attending physician.  Recommendations may be updated based on patient status, additional functional criteria and insurance authorization.  Follow Up Recommendations  Follow physician's recommendations for discharge plan and follow up therapies     Assistance Recommended at Discharge Frequent or constant Supervision/Assistance  Patient can return home with the following A little help with walking and/or transfers;A little help with bathing/dressing/bathroom;Assistance with cooking/housework;Assist for transportation;Help with stairs or ramp for entrance   Equipment Recommendations  None recommended by PT    Recommendations for Other Services       Precautions / Restrictions Precautions Precautions: Fall;Knee Restrictions LLE Weight Bearing: Weight bearing as tolerated     Mobility  Bed Mobility Overal bed mobility: Needs Assistance Bed Mobility: Supine to Sit     Supine to sit: Supervision          Transfers Overall transfer level: Needs assistance Equipment used: Rolling walker (2 wheels) Transfers: Sit to/from Stand Sit to Stand: Min guard           General transfer comment: verbal cues for UE and LE positioning    Ambulation/Gait Ambulation/Gait assistance: Min guard Gait Distance (Feet): 50 Feet Assistive device: Rolling walker (2  wheels) Gait Pattern/deviations: Step-to pattern, Antalgic, Decreased stance time - left       General Gait Details: verbal cues for sequence, RW positioning, allowing knee flexion; distance to tolerance   Stairs             Wheelchair Mobility    Modified Rankin (Stroke Patients Only)       Balance                                            Cognition Arousal/Alertness: Awake/alert Behavior During Therapy: WFL for tasks assessed/performed Overall Cognitive Status: Within Functional Limits for tasks assessed                                          Exercises Total Joint Exercises Ankle Circles/Pumps: AROM, 10 reps, Both Quad Sets: Both, 10 reps, AROM Heel Slides: AAROM, Left, 10 reps Hip ABduction/ADduction: AAROM, Left, 10 reps Straight Leg Raises: AAROM, Left, 10 reps    General Comments        Pertinent Vitals/Pain Pain Assessment Pain Assessment: 0-10 Pain Score: 5  Pain Location: Left knee Pain Descriptors / Indicators: Sore, Aching Pain Intervention(s): Repositioned, Monitored during session    Home Living                          Prior Function            PT Goals (  current goals can now be found in the care plan section) Progress towards PT goals: Progressing toward goals    Frequency    7X/week      PT Plan Current plan remains appropriate    Co-evaluation              AM-PAC PT "6 Clicks" Mobility   Outcome Measure  Help needed turning from your back to your side while in a flat bed without using bedrails?: None Help needed moving from lying on your back to sitting on the side of a flat bed without using bedrails?: A Little Help needed moving to and from a bed to a chair (including a wheelchair)?: A Little Help needed standing up from a chair using your arms (e.g., wheelchair or bedside chair)?: A Little Help needed to walk in hospital room?: A Little Help needed climbing 3-5  steps with a railing? : A Little 6 Click Score: 19    End of Session Equipment Utilized During Treatment: Gait belt Activity Tolerance: Patient tolerated treatment well Patient left: in chair;with call bell/phone within reach;with chair alarm set   PT Visit Diagnosis: Other abnormalities of gait and mobility (R26.89)     Time: 2542-7062 PT Time Calculation (min) (ACUTE ONLY): 20 min  Charges:  $Therapeutic Exercise: 8-22 mins                    Thomasene Mohair PT, DPT Physical Therapist Acute Rehabilitation Services Preferred contact method: Secure Chat Weekend Pager Only: [phone] Office: [phone]    Kati L Payson 06/03/2022, 1:19 PM

## 2022-06-03 NOTE — Progress Notes (Signed)
Patient ID: Ernest Haber, female   DOB: 02-09-1961, 61 y.o.   MRN: [id number] Subjective: 1 Day Post-Op Procedure(s) (LRB): TOTAL KNEE ARTHROPLASTY (Left)    Patient reports pain as moderate. No events over night.  She feels like she is doing pretty good at this point  Objective:   VITALS:   Vitals:   06/03/22 0053 06/03/22 0557  BP: 131/78 (!) 144/97  Pulse: 92 99  Resp: 17 17  Temp: 97.8 F (36.6 C) 97.9 F (36.6 C)  SpO2: 95% 94%    Neurovascular intact Incision: dressing C/D/I  LABS Recent Labs    06/03/22 0344  HGB 12.8  HCT 39.8  WBC 12.8*  PLT 176    Recent Labs    06/03/22 0344  NA 137  K 4.7  BUN 18  CREATININE 0.84  GLUCOSE 177*    No results for input(s): "LABPT", "INR" in the last 72 hours.   Assessment/Plan: 1 Day Post-Op Procedure(s) (LRB): TOTAL KNEE ARTHROPLASTY (Left)   Advance diet Up with therapy Home today after therapy RTC in 2 weeks Out patient PT already set up

## 2022-06-03 NOTE — TOC Transition Note (Signed)
Transition of Care [facility]) - CM/SW Discharge Note   Patient Details  Name: Brittany Todd MRN: [id number] Date of Birth: [birth year]  Transition of Care [facility]) CM/SW Contact:  Lennart Pall, LCSW Phone Number: 06/03/2022, 10:31 AM   Clinical Narrative:    Met with pt and confirming she has all needed DME at home.  OPPT already arranged with [facility].  No TOC needs.   Final next level of care: OP Rehab Barriers to Discharge: No Barriers Identified   Patient Goals and CMS Choice Patient states their goals for this hospitalization and ongoing recovery are:: return home      Discharge Placement                       Discharge Plan and Services                DME Arranged: N/A                    Social Determinants of Health (SDOH) Interventions     Readmission Risk Interventions     No data to display

## 2022-06-03 NOTE — Progress Notes (Signed)
Physical Therapy Treatment Patient Details Name: Brittany Todd MRN: [id number] DOB: 06/01/1961 Today's Date: 06/03/2022   History of Present Illness Pt is a 61yo female presenting s/p L-TKA on 06/02/22. 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    PT Comments    Pt ambulated in hallway and practiced one step.  Pt reports understanding and feels ready for d/c home today.  Pt also provided with HEP.    Recommendations for follow up therapy are one component of a multi-disciplinary discharge planning process, led by the attending physician.  Recommendations may be updated based on patient status, additional functional criteria and insurance authorization.  Follow Up Recommendations  Follow physician's recommendations for discharge plan and follow up therapies     Assistance Recommended at Discharge Frequent or constant Supervision/Assistance  Patient can return home with the following A little help with walking and/or transfers;A little help with bathing/dressing/bathroom;Assistance with cooking/housework;Assist for transportation;Help with stairs or ramp for entrance   Equipment Recommendations  None recommended by PT    Recommendations for Other Services       Precautions / Restrictions Precautions Precautions: Fall;Knee Restrictions LLE Weight Bearing: Weight bearing as tolerated     Mobility  Bed Mobility Overal bed mobility: Needs Assistance Bed Mobility: Supine to Sit     Supine to sit: Supervision     General bed mobility comments: pt in recliner    Transfers Overall transfer level: Needs assistance Equipment used: Rolling walker (2 wheels) Transfers: Sit to/from Stand Sit to Stand: Min guard           General transfer comment: verbal cues for UE and LE positioning    Ambulation/Gait Ambulation/Gait assistance: Min guard Gait Distance (Feet): 100 Feet Assistive  device: Rolling walker (2 wheels) Gait Pattern/deviations: Step-to pattern, Antalgic, Decreased stance time - left       General Gait Details: verbal cues for sequence, RW positioning, allowing knee flexion   Stairs Stairs: Yes Stairs assistance: Supervision Stair Management: Step to pattern, Backwards, With walker Number of Stairs: 1 General stair comments: verbal cues for safety and sequencing; pt performed once and reports understanding   Wheelchair Mobility    Modified Rankin (Stroke Patients Only)       Balance                                            Cognition Arousal/Alertness: Awake/alert Behavior During Therapy: WFL for tasks assessed/performed Overall Cognitive Status: Within Functional Limits for tasks assessed                                          Exercises Total Joint Exercises Ankle Circles/Pumps: AROM, 10 reps, Both Quad Sets: Both, 10 reps, AROM Heel Slides: AAROM, Left, 10 reps Hip ABduction/ADduction: AAROM, Left, 10 reps Straight Leg Raises: AAROM, Left, 10 reps    General Comments        Pertinent Vitals/Pain Pain Assessment Pain Assessment: 0-10 Pain Score: 5  Pain Location: Left knee Pain Descriptors / Indicators: Sore, Aching Pain Intervention(s): Repositioned, Monitored during session    Home Living  Prior Function            PT Goals (current goals can now be found in the care plan section) Progress towards PT goals: Progressing toward goals    Frequency    7X/week      PT Plan Current plan remains appropriate    Co-evaluation              AM-PAC PT "6 Clicks" Mobility   Outcome Measure  Help needed turning from your back to your side while in a flat bed without using bedrails?: None Help needed moving from lying on your back to sitting on the side of a flat bed without using bedrails?: A Little Help needed moving to and from a bed  to a chair (including a wheelchair)?: A Little Help needed standing up from a chair using your arms (e.g., wheelchair or bedside chair)?: A Little Help needed to walk in hospital room?: A Little Help needed climbing 3-5 steps with a railing? : A Little 6 Click Score: 19    End of Session Equipment Utilized During Treatment: Gait belt Activity Tolerance: Patient tolerated treatment well Patient left: in chair;with call bell/phone within reach;with chair alarm set Nurse Communication: Mobility status PT Visit Diagnosis: Other abnormalities of gait and mobility (R26.89)     Time: 1214-1227 PT Time Calculation (min) (ACUTE ONLY): 13 min  Charges:  $Gait Training: 8-22 mins                    Brittany Todd, Brittany Todd Physical Therapist Acute Rehabilitation Services Preferred contact method: Secure Chat Weekend Pager Only: [phone] Office: [phone]    Brittany Todd [place] 06/03/2022, 1:31 PM

## 2022-06-09 NOTE — Discharge Summary (Signed)
Patient ID: Brittany Todd MRN: [id number] DOB/AGE: 1961/01/14 61 y.o.  Admit date: 06/02/2022 Discharge date: 06/03/2022  Admission Diagnoses:  Left knee osteoarthritis   Discharge Diagnoses:  Principal Problem:   S/P total knee arthroplasty, left   Past Medical History:  Diagnosis Date   ADD (attention deficit disorder)    Anxiety    Arthritis    Chest tightness    DOE (dyspnea on exertion)    Factor V Leiden (HCC)    GERD (gastroesophageal reflux disease)    Hashimoto's disease    History of kidney stones    Hyperlipemia    Hypertension    Hypothyroid    Migraines    PONV (postoperative nausea and vomiting)    Renal stone    Sleep apnea    TMJ (dislocation of temporomandibular joint)    Vitamin D deficiency     Surgeries: Procedure(s): TOTAL KNEE ARTHROPLASTY on 06/02/2022   Consultants:   Discharged Condition: Improved  Hospital Course: Brittany Todd is an 61 y.o. female who was admitted 06/02/2022 for operative treatment ofS/P total knee arthroplasty, left. Patient has severe unremitting pain that affects sleep, daily activities, and work/hobbies. After pre-op clearance the patient was taken to the operating room on 06/02/2022 and underwent  Procedure(s): TOTAL KNEE ARTHROPLASTY.    Patient was given perioperative antibiotics:  Anti-infectives (From admission, onward)    Start     Dose/Rate Route Frequency Ordered Stop   06/02/22 1915  ceFAZolin (ANCEF) IVPB 2g/100 mL premix        2 g 200 mL/hr over 30 Minutes Intravenous Every 6 hours 06/02/22 1604 06/03/22 0114   06/02/22 1030  ceFAZolin (ANCEF) IVPB 2g/100 mL premix        2 g 200 mL/hr over 30 Minutes Intravenous On call to O.R. 06/02/22 1016 06/02/22 1305   06/02/22 1015  vancomycin (VANCOCIN) IVPB 1000 mg/200 mL premix        1,000 mg 200 mL/hr over 60 Minutes Intravenous 60 min pre-op 06/02/22 1015 06/02/22 1701        Patient was given sequential compression devices, early ambulation, and  chemoprophylaxis to prevent DVT. Patient worked with PT and was meeting their goals regarding safe ambulation and transfers.  Patient benefited maximally from hospital stay and there were no complications.    Recent vital signs: No data found.   Recent laboratory studies: No results for input(s): "WBC", "HGB", "HCT", "PLT", "NA", "K", "CL", "CO2", "BUN", "CREATININE", "GLUCOSE", "INR", "CALCIUM" in the last 72 hours.  Invalid input(s): "PT", "2"   Discharge Medications:   Allergies as of 06/03/2022       Reactions   Ozempic (0.25 Or 0.5 Mg-dose) [semaglutide(0.25 Or 0.5mg -dos)] Diarrhea, Nausea And Vomiting   Codeine Other (See Comments)   hallucinations   Cymbalta [duloxetine Hcl] Other (See Comments)   lethargic   Morphine And Related Other (See Comments)   Hallucinations         Medication List     STOP taking these medications    aspirin EC 81 MG tablet   ondansetron 4 MG disintegrating tablet Commonly known as: ZOFRAN-ODT   rosuvastatin 40 MG tablet Commonly known as: CRESTOR       TAKE these medications    acetaminophen 500 MG tablet Commonly known as: TYLENOL Take 2 tablets (1,000 mg total) by mouth every 6 (six) hours. What changed:  medication strength how much to take when to take this   acyclovir 400 MG tablet Commonly known as: ZOVIRAX  Take 400 mg by mouth 3 (three) times daily as needed (outbreaks). Notes to patient: Resume home regimen   amphetamine-dextroamphetamine 10 MG tablet Commonly known as: ADDERALL Take 10 mg by mouth 2 (two) times daily with a meal.   Cyanocobalamin 5000 MCG Caps Take 5,000 mcg by mouth daily. Notes to patient: Resume home regimen   docusate sodium 100 MG capsule Commonly known as: COLACE Take 1 capsule (100 mg total) by mouth 2 (two) times daily.   ezetimibe 10 MG tablet Commonly known as: ZETIA Take 10 mg by mouth daily.   HYDROmorphone 2 MG tablet Commonly known as: DILAUDID Take 1 tablet (2 mg  total) by mouth every 4 (four) hours as needed for severe pain. What changed: Another medication with the same name was added. Make sure you understand how and when to take each.   HYDROmorphone 2 MG tablet Commonly known as: DILAUDID Take 1 tablet (2 mg total) by mouth every 4 (four) hours as needed for moderate pain (pains score 4-6). What changed: You were already taking a medication with the same name, and this prescription was added. Make sure you understand how and when to take each.   levothyroxine 88 MCG tablet Commonly known as: SYNTHROID Take 88 mcg by mouth daily before breakfast.   losartan 50 MG tablet Commonly known as: COZAAR Take 50 mg by mouth daily.   MAGNESIUM PO Take 400 mg by mouth daily.   meclizine 25 MG tablet Commonly known as: ANTIVERT Take 12.5-25 mg by mouth 3 (three) times daily as needed for nausea or dizziness. Notes to patient: Resume home regimen   methocarbamol 500 MG tablet Commonly known as: ROBAXIN Take 1 tablet (500 mg total) by mouth every 6 (six) hours as needed for muscle spasms. Notes to patient: Last dose given 10/04 03:44am   metoCLOPramide 5 MG tablet Commonly known as: REGLAN Take 1-2 tablets (5-10 mg total) by mouth every 8 (eight) hours as needed for nausea (if ondansetron (ZOFRAN) ineffective.). Notes to patient: Last dose given 10/04 09:07am   omeprazole 20 MG tablet Commonly known as: PRILOSEC OTC Take 20 mg by mouth daily as needed (acid reflux). Notes to patient: Resume home regimen   ondansetron 4 MG tablet Commonly known as: ZOFRAN Take 1 tablet (4 mg total) by mouth every 6 (six) hours as needed for nausea. Notes to patient: Last dose given 10/04 07:38am   polyethylene glycol 17 g packet Commonly known as: MIRALAX / GLYCOLAX Take 17 g by mouth 2 (two) times daily. What changed:  when to take this reasons to take this   rivaroxaban 10 MG Tabs tablet Commonly known as: XARELTO Take 1 tablet (10 mg total) by  mouth daily with breakfast. What changed: additional instructions   SUMAtriptan 100 MG tablet Commonly known as: IMITREX Take 1 tablet (100 mg total) by mouth once as needed for up to 1 dose. May repeat in 2 hours if headache persists or recurs. Notes to patient: Resume home regimen   Vitamin D3 125 MCG (5000 UT) Tabs Take 5,000 Units by mouth daily. Notes to patient: Resume home regimen   zinc gluconate 50 MG tablet Take 50 mg by mouth daily. Notes to patient: Resume home regimen   zonisamide 100 MG capsule Commonly known as: ZONEGRAN Take 100 mg by mouth daily. Notes to patient: Resume home regimen        Diagnostic Studies: No results found.  Disposition: Discharge disposition: 01-Home or Self Care  Follow-up Information     Durene Romans, MD. Schedule an appointment as soon as possible for a visit in 2 week(s).   Specialty: Orthopedic Surgery Contact information: [address] [place] [address] [phone]                  Signed: Cassandria Anger 06/09/2022, 3:20 PM

## 2022-08-27 ENCOUNTER — Emergency Department (HOSPITAL_BASED_OUTPATIENT_CLINIC_OR_DEPARTMENT_OTHER)
Admission: EM | Admit: 2022-08-27 | Discharge: 2022-08-27 | Discharge status: Against Medical Advice | Payer: BC Managed Care – PPO | Attending: Emergency Medicine | Admitting: Emergency Medicine

## 2022-08-27 ENCOUNTER — Other Ambulatory Visit: Payer: Self-pay

## 2022-08-27 DIAGNOSIS — Z5321 Procedure and treatment not carried out due to patient leaving prior to being seen by health care provider: Secondary | ICD-10-CM | POA: Insufficient documentation

## 2022-08-27 DIAGNOSIS — Z1152 Encounter for screening for COVID-19: Secondary | ICD-10-CM | POA: Insufficient documentation

## 2022-08-27 DIAGNOSIS — R1030 Lower abdominal pain, unspecified: Secondary | ICD-10-CM | POA: Diagnosis not present

## 2022-08-27 DIAGNOSIS — R112 Nausea with vomiting, unspecified: Secondary | ICD-10-CM | POA: Diagnosis not present

## 2022-08-27 LAB — COMPREHENSIVE METABOLIC PANEL
ALT: 18 U/L (ref 0–44)
AST: 17 U/L (ref 15–41)
Albumin: 4.7 g/dL (ref 3.5–5.0)
Alkaline Phosphatase: 130 U/L — ABNORMAL HIGH (ref 38–126)
Anion gap: 12 (ref 5–15)
BUN: 28 mg/dL — ABNORMAL HIGH (ref 8–23)
CO2: 31 mmol/L (ref 22–32)
Calcium: 10.3 mg/dL (ref 8.9–10.3)
Chloride: 103 mmol/L (ref 98–111)
Creatinine, Ser: 1.07 mg/dL — ABNORMAL HIGH (ref 0.44–1.00)
GFR, Estimated: 58 mL/min — ABNORMAL LOW (ref >60)
Glucose, Bld: 132 mg/dL — ABNORMAL HIGH (ref 70–99)
Potassium: 4 mmol/L (ref 3.5–5.1)
Sodium: 146 mmol/L — ABNORMAL HIGH (ref 135–145)
Total Bilirubin: 0.8 mg/dL (ref 0.3–1.2)
Total Protein: 7.5 g/dL (ref 6.5–8.1)

## 2022-08-27 LAB — URINALYSIS, ROUTINE W REFLEX MICROSCOPIC
Bilirubin Urine: NEGATIVE
Glucose, UA: NEGATIVE mg/dL
Ketones, ur: NEGATIVE mg/dL
Leukocytes,Ua: NEGATIVE
Nitrite: NEGATIVE
Protein, ur: 30 mg/dL — AB
RBC / HPF: >50 RBC/hpf — ABNORMAL HIGH (ref 0–5)
Specific Gravity, Urine: 1.017 (ref 1.005–1.030)
pH: 7.5 (ref 5.0–8.0)

## 2022-08-27 LAB — CBC
HCT: 45.8 % (ref 36.0–46.0)
Hemoglobin: 15.5 g/dL — ABNORMAL HIGH (ref 12.0–15.0)
MCH: 29.9 pg (ref 26.0–34.0)
MCHC: 33.8 g/dL (ref 30.0–36.0)
MCV: 88.2 fL (ref 80.0–100.0)
Platelets: 242 10*3/uL (ref 150–400)
RBC: 5.19 MIL/uL — ABNORMAL HIGH (ref 3.87–5.11)
RDW: 12.9 % (ref 11.5–15.5)
WBC: 4.8 10*3/uL (ref 4.0–10.5)
nRBC: 0 % (ref 0.0–0.2)

## 2022-08-27 LAB — RESP PANEL BY RT-PCR (RSV, FLU A&B, COVID)  RVPGX2
Influenza A by PCR: NEGATIVE
Influenza B by PCR: NEGATIVE
Resp Syncytial Virus by PCR: NEGATIVE
SARS Coronavirus 2 by RT PCR: NEGATIVE

## 2022-08-27 LAB — LIPASE, BLOOD: Lipase: 45 U/L (ref 11–51)

## 2022-08-27 NOTE — ED Triage Notes (Signed)
Pt had BM 2-3 hours ago and after started to have excruciating abd cramps and felt nauseated no vomiting. Pt is primarily left lower side. Pain has eased off.

## 2024-07-05 ENCOUNTER — Other Ambulatory Visit (HOSPITAL_BASED_OUTPATIENT_CLINIC_OR_DEPARTMENT_OTHER): Payer: Self-pay | Admitting: Obstetrics and Gynecology

## 2024-07-05 DIAGNOSIS — Z9189 Other specified personal risk factors, not elsewhere classified: Secondary | ICD-10-CM

## 2024-10-07 ENCOUNTER — Other Ambulatory Visit (HOSPITAL_BASED_OUTPATIENT_CLINIC_OR_DEPARTMENT_OTHER): Payer: Self-pay
